# Patient Record
Sex: Female | Born: 1985 | Race: White | Hispanic: No | Marital: Single | Smoking: Never smoker
Health system: Southern US, Community
[De-identification: ages and names within clinical notes are randomized; demographics above are authoritative.]

## PROBLEM LIST (undated history)

## (undated) ENCOUNTER — Inpatient Hospital Stay (HOSPITAL_COMMUNITY): Payer: Self-pay

## (undated) DIAGNOSIS — F419 Anxiety disorder, unspecified: Secondary | ICD-10-CM

## (undated) DIAGNOSIS — M795 Residual foreign body in soft tissue: Secondary | ICD-10-CM

## (undated) DIAGNOSIS — K589 Irritable bowel syndrome without diarrhea: Secondary | ICD-10-CM

## (undated) DIAGNOSIS — R87619 Unspecified abnormal cytological findings in specimens from cervix uteri: Secondary | ICD-10-CM

## (undated) DIAGNOSIS — K56609 Unspecified intestinal obstruction, unspecified as to partial versus complete obstruction: Secondary | ICD-10-CM

## (undated) DIAGNOSIS — F4310 Post-traumatic stress disorder, unspecified: Secondary | ICD-10-CM

## (undated) DIAGNOSIS — O24419 Gestational diabetes mellitus in pregnancy, unspecified control: Secondary | ICD-10-CM

## (undated) DIAGNOSIS — K5090 Crohn's disease, unspecified, without complications: Secondary | ICD-10-CM

## (undated) HISTORY — PX: COLPOSCOPY: SHX161

## (undated) HISTORY — DX: Residual foreign body in soft tissue: M79.5

## (undated) HISTORY — DX: Unspecified abnormal cytological findings in specimens from cervix uteri: R87.619

## (undated) HISTORY — DX: Post-traumatic stress disorder, unspecified: F43.10

## (undated) HISTORY — DX: Unspecified intestinal obstruction, unspecified as to partial versus complete obstruction: K56.609

## (undated) HISTORY — DX: Anxiety disorder, unspecified: F41.9

## (undated) HISTORY — DX: Irritable bowel syndrome, unspecified: K58.9

## (undated) HISTORY — DX: Crohn's disease, unspecified, without complications: K50.90

---

## 2008-09-11 ENCOUNTER — Emergency Department (HOSPITAL_BASED_OUTPATIENT_CLINIC_OR_DEPARTMENT_OTHER): Admission: EM | Admit: 2008-09-11 | Discharge: 2008-09-12 | Payer: Self-pay | Admitting: Emergency Medicine

## 2008-09-12 ENCOUNTER — Ambulatory Visit: Payer: Self-pay | Admitting: Diagnostic Radiology

## 2010-04-07 IMAGING — CT CT ABDOMEN W/ CM
2 of 4 series · 15 of 46 positions shown, 17 images · IV contrast (APPLIED)
Comparison: None

CT ABDOMEN

CLINICAL DATA: Left upper quadrant abdominal pain.  Nausea and
vomiting.

CT ABDOMEN AND PELVIS WITH CONTRAST
TECHNIQUE: Multidetector CT imaging of the abdomen and pelvis was
performed using the standard protocol following bolus
administration of intravenous contrast.
Contrast: 100 ml [id number]

[Series 2: abd/pelvis 5.0 b31f · axial · 0.75mm/px · z∈[-720,-324]mm · 12 of 87 slices shown, 14 images]
[im 4/87  soft-tissue]
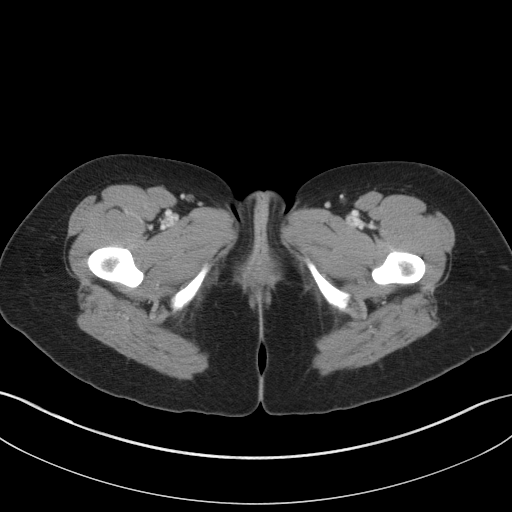
[im 4/87  bone]
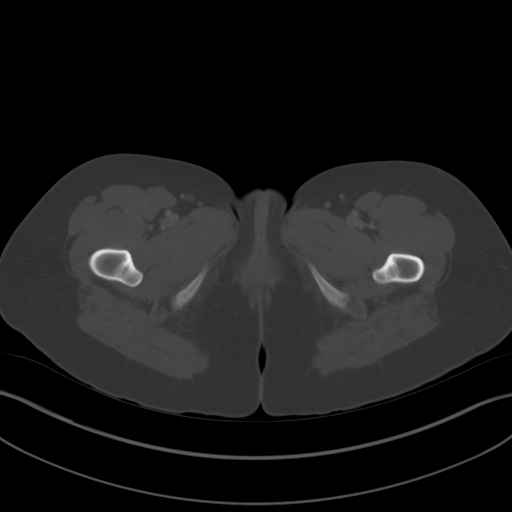
[im 12/87  soft-tissue]
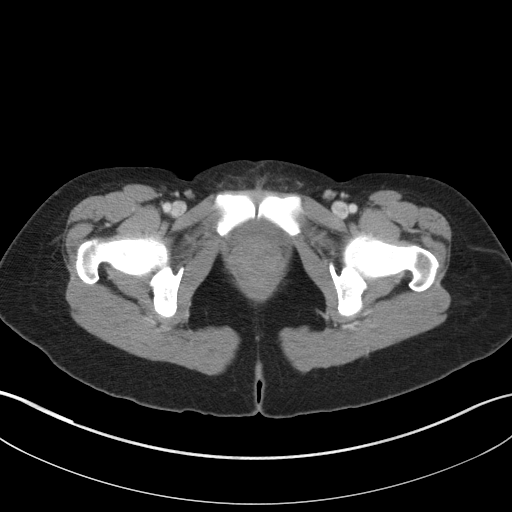
[im 19/87  soft-tissue]
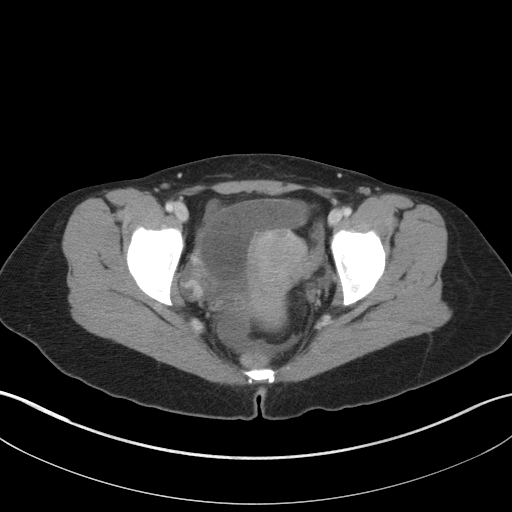
[im 27/87  soft-tissue]
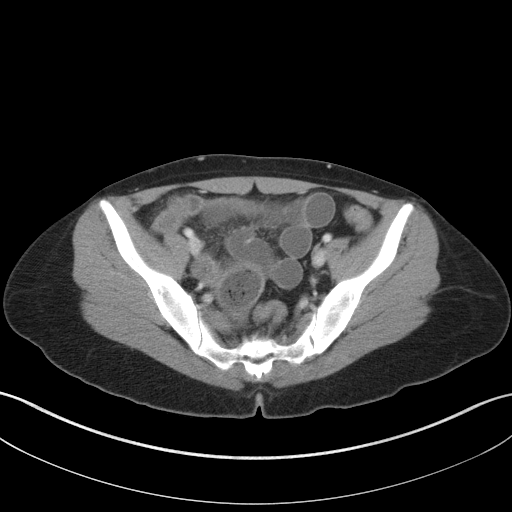
[im 34/87  soft-tissue]
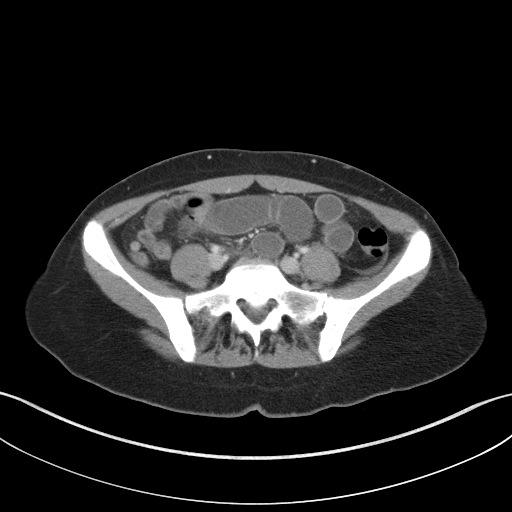
[im 42/87  soft-tissue]
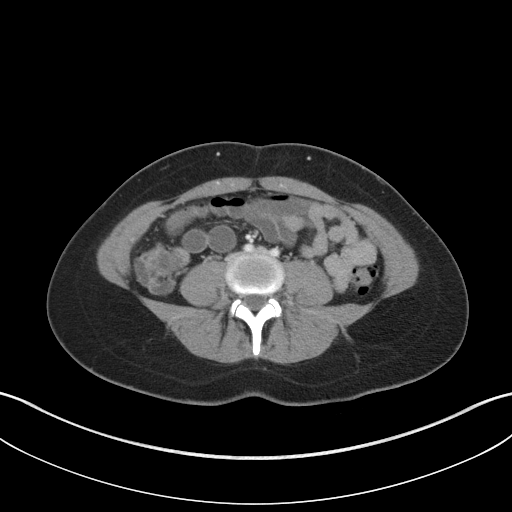
[im 45/87  soft-tissue]
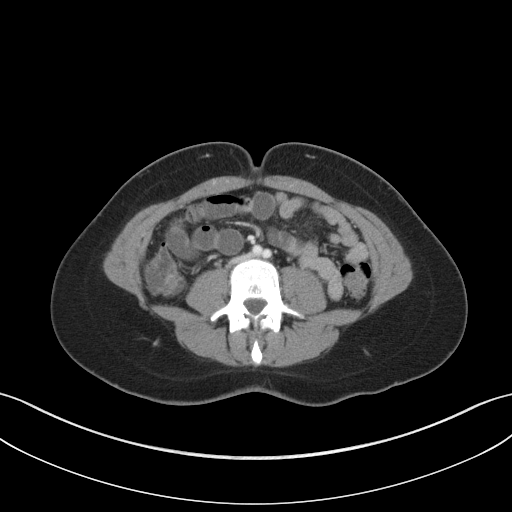
[im 53/87  soft-tissue]
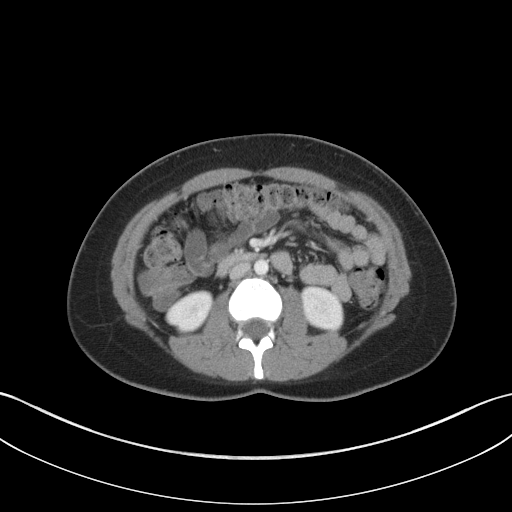
[im 60/87  soft-tissue]
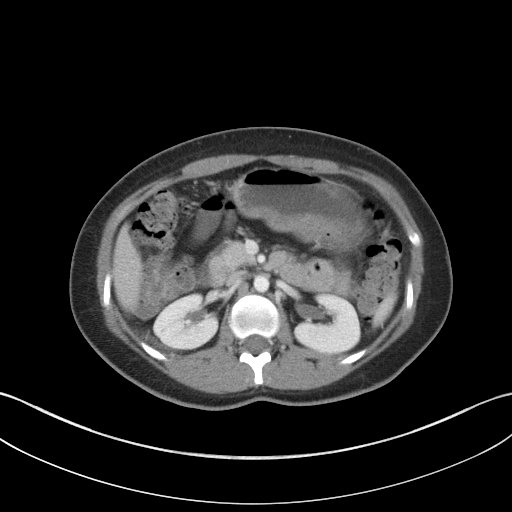
[im 60/87  bone]
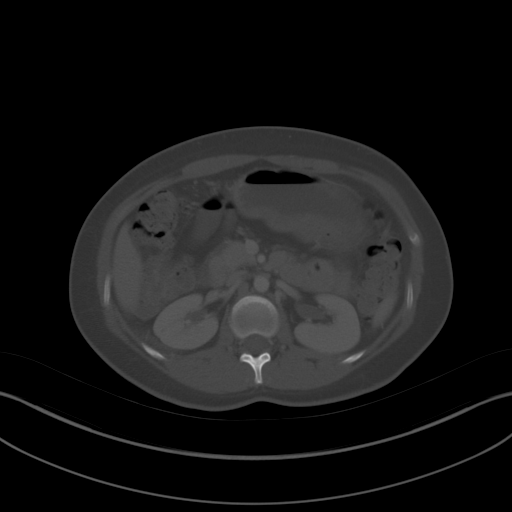
[im 68/87  soft-tissue]
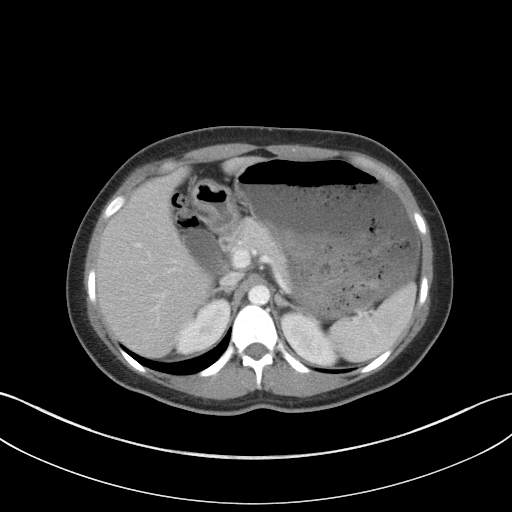
[im 75/87  soft-tissue]
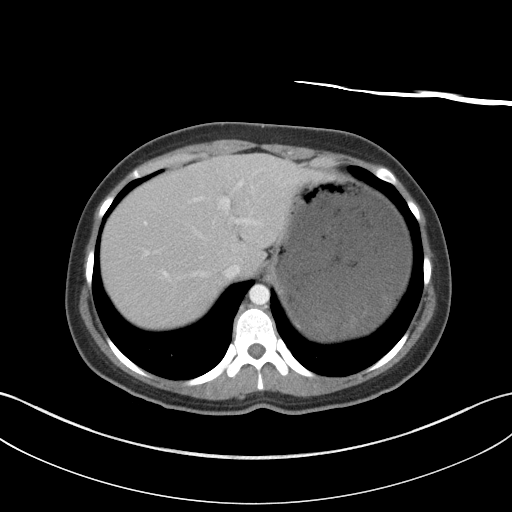
[im 83/87  soft-tissue]
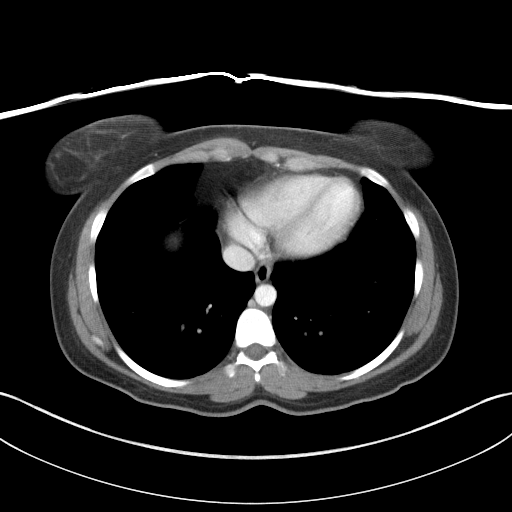

[Series 5: abd/pelvis 3.0 coronal · coronal · 0.73mm/px · 3 of 66 slices shown]
[im 22/66  soft-tissue]
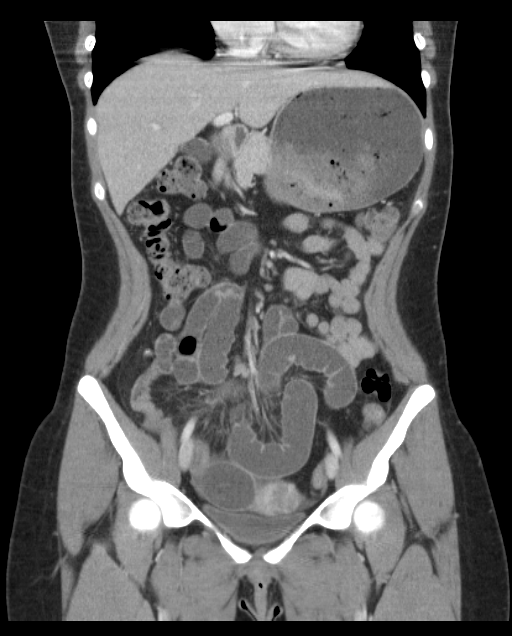
[im 29/66  soft-tissue]
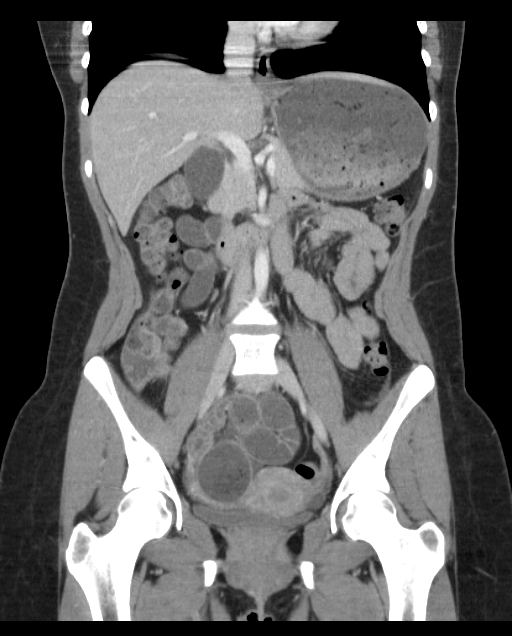
[im 37/66  soft-tissue]
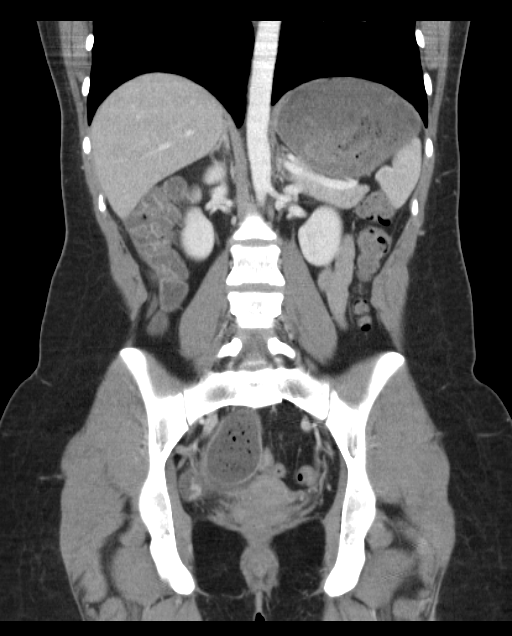

[15 of 46 positions shown; findings below may reference images not displayed]

FINDINGS: There is prominent distention of the stomach with
considerable food material in the stomach.  Although the proximal
small bowel is not dilated, there are loops of dilated small bowel
extending in the lower abdomen.  Please see CT the pelvis report
below.

The liver, spleen, pancreas, and adrenal glands appear
unremarkable.

The gallbladder and biliary system appear unremarkable.

The kidneys appear unremarkable, as do the proximal ureters.
IMPRESSION: 1.  Gastric distention, with dilated loops of mid abdominal small
bowel extending down into the pelvis.

CT PELVIS
FINDINGS: Dilated pelvic loops of small bowel are present
measuring up to 4 cm in diameter.  There is a transition point
between dilated and nondilated bowel in the mid pelvis just
eccentric to the right, as shown on image 29 of series 5.  There is
abnormal stranding in the adjacent mesentery at this transition
point, along with angulation of bowel loop.  By report the patient
has not had prior abdominal surgery.  The small bowel distal to
this transition point is small in caliber, but has mucosal
enhancement which may be an indicator of inflammation.

There is a small amount of free pelvic fluid

The left ovary appears unremarkable.  There is some faint
curvilinear enhancement within or along the right ovary, query
corpus luteum enhancement.  I would recommend a pregnancy test if
not already performed to exclude the unlikely possibility of
ectopic pregnancy on the right.

The appendix appears unremarkable.
IMPRESSION: 1.  Angulated loop of bowel is a transition point between dilated
in nondilated bowel.  There is some adjacent mesenteric edema as
well as free pelvic fluid.  The appearance is compatible with small
bowel obstruction.  There is mild mucosal enhancement of the small
bowel distal to the obstruction site, potentially representing mild
inflammation.
2.  Enhancement within the right ovary probably represents a corpus
luteum.  I do recommend a pregnancy test if not already performed
to exclude the unlikely possibility of occult ectopic pregnancy.

## 2010-08-10 LAB — DIFFERENTIAL
Eosinophils Relative: 1 % (ref 0–5)
Lymphocytes Relative: 15 % (ref 12–46)
Monocytes Absolute: 0.8 10*3/uL (ref 0.1–1.0)
Monocytes Relative: 5 % (ref 3–12)
Neutro Abs: 12.2 10*3/uL — ABNORMAL HIGH (ref 1.7–7.7)

## 2010-08-10 LAB — URINALYSIS, ROUTINE W REFLEX MICROSCOPIC
Nitrite: NEGATIVE
Protein, ur: NEGATIVE mg/dL
Specific Gravity, Urine: 1.015 (ref 1.005–1.030)
Urobilinogen, UA: 0.2 mg/dL (ref 0.0–1.0)

## 2010-08-10 LAB — COMPREHENSIVE METABOLIC PANEL
ALT: 13 U/L (ref 0–35)
AST: 22 U/L (ref 0–37)
CO2: 27 mEq/L (ref 19–32)
Calcium: 9.6 mg/dL (ref 8.4–10.5)
Creatinine, Ser: 0.8 mg/dL (ref 0.4–1.2)
GFR calc Af Amer: >60 mL/min (ref >60)
GFR calc non Af Amer: >60 mL/min (ref >60)
Sodium: 143 mEq/L (ref 135–145)
Total Protein: 7.9 g/dL (ref 6.0–8.3)

## 2010-08-10 LAB — CBC
HCT: 41 % (ref 36.0–46.0)
Hemoglobin: 14.2 g/dL (ref 12.0–15.0)
MCHC: 34.6 g/dL (ref 30.0–36.0)
RBC: 4.28 MIL/uL (ref 3.87–5.11)
RDW: 12.1 % (ref 11.5–15.5)

## 2010-08-10 LAB — PREGNANCY, URINE: Preg Test, Ur: NEGATIVE

## 2015-01-01 HISTORY — PX: BREAST REDUCTION SURGERY: SHX8

## 2016-01-31 DIAGNOSIS — M795 Residual foreign body in soft tissue: Secondary | ICD-10-CM

## 2016-01-31 HISTORY — DX: Residual foreign body in soft tissue: M79.5

## 2016-12-08 ENCOUNTER — Other Ambulatory Visit (HOSPITAL_COMMUNITY)
Admission: RE | Admit: 2016-12-08 | Discharge: 2016-12-08 | Disposition: A | Discharge status: Home / Self Care | Payer: Self-pay | Source: Ambulatory Visit | Attending: Obstetrics & Gynecology | Admitting: Obstetrics & Gynecology

## 2016-12-08 ENCOUNTER — Ambulatory Visit (INDEPENDENT_AMBULATORY_CARE_PROVIDER_SITE_OTHER): Payer: Managed Care, Other (non HMO) | Admitting: Obstetrics & Gynecology

## 2016-12-08 ENCOUNTER — Encounter: Payer: Self-pay | Admitting: Obstetrics & Gynecology

## 2016-12-08 VITALS — BP 98/62 | HR 64 | Resp 16 | Ht 62.0 in | Wt 132.0 lb

## 2016-12-08 DIAGNOSIS — Z202 Contact with and (suspected) exposure to infections with a predominantly sexual mode of transmission: Secondary | ICD-10-CM

## 2016-12-08 DIAGNOSIS — Z01419 Encounter for gynecological examination (general) (routine) without abnormal findings: Secondary | ICD-10-CM

## 2016-12-08 DIAGNOSIS — Z124 Encounter for screening for malignant neoplasm of cervix: Secondary | ICD-10-CM | POA: Diagnosis not present

## 2016-12-08 MED ORDER — NORETHIN ACE-ETH ESTRAD-FE 1-20 MG-MCG PO TABS: 1.0000 | ORAL_TABLET | Freq: Every day | ORAL | 3 refills | Status: DC

## 2016-12-08 NOTE — Addendum Note (Signed)
Addended by: Megan Salon on: 12/08/2016 05:28 PM   Modules accepted: Orders
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
## 2016-12-09 LAB — HEP, RPR, HIV PANEL
HIV SCREEN 4TH GENERATION: NONREACTIVE
Hepatitis B Surface Ag: NEGATIVE
RPR: NONREACTIVE

## 2016-12-12 LAB — CYTOLOGY - PAP
CHLAMYDIA, DNA PROBE: NEGATIVE
Diagnosis: NEGATIVE
HPV (WINDOPATH): NOT DETECTED
Neisseria Gonorrhea: NEGATIVE

## 2016-12-13 ENCOUNTER — Telehealth: Payer: Self-pay | Admitting: *Deleted

## 2016-12-13 NOTE — Telephone Encounter (Signed)
Message left to return call to N[place] Wainwright at [phone].    

## 2016-12-13 NOTE — Telephone Encounter (Signed)
-----   Message from Megan Salon, MD sent at 12/09/2016  5:52 PM EDT ----- Please notify pt that her Hep B, RPR, and HIV were negative.  GC/Chl and pap are pending.  You can hold for these results as well.

## 2016-12-13 NOTE — Telephone Encounter (Signed)
-----   Message from Megan Salon, MD sent at 12/13/2016 12:18 AM EDT ----- Please notify pt her pap is negative and HR HPV is negative as well.  02 recall.  Also, GC/Chl negative.  You can let her know about blood results as well.  Routed those results to you earlier.  Thanks.

## 2016-12-13 NOTE — Telephone Encounter (Signed)
Patient returned call. All results reviewed with patient and she verbalized understanding. Patient has aex scheduled for 02/13/18.  Patient agreeable to disposition. Will close encounter.

## 2017-01-04 ENCOUNTER — Telehealth: Payer: Self-pay | Admitting: Obstetrics & Gynecology

## 2017-01-04 NOTE — Telephone Encounter (Signed)
Returned patient's call, LMOVM to call me back.

## 2017-01-04 NOTE — Telephone Encounter (Signed)
Patient called and said she thinks she may have a urinary tract infection. She declined to schedule an appointment this week "because of work."

## 2017-01-05 NOTE — Telephone Encounter (Signed)
Called patient and LMOVM to call me back.

## 2017-01-10 NOTE — Telephone Encounter (Signed)
Dr.Miller, Marisa Sprinkles, CMA has attempted to reach this patient x 2 with no return call. Okay to close?

## 2017-01-10 NOTE — Telephone Encounter (Signed)
Ok to close encounter. 

## 2017-02-27 ENCOUNTER — Other Ambulatory Visit: Payer: Self-pay | Admitting: Obstetrics & Gynecology

## 2017-02-27 MED ORDER — NORETHIN ACE-ETH ESTRAD-FE 1-20 MG-MCG PO TABS: 1.0000 | ORAL_TABLET | Freq: Every day | ORAL | 3 refills | Status: DC

## 2017-02-27 NOTE — Telephone Encounter (Signed)
Medication refill request: OCP  Last AEX:  12-08-16  Next AEX: 02-13-18  Last MMG (if hormonal medication request): N/A Refill authorized: please advise

## 2017-02-27 NOTE — Telephone Encounter (Signed)
Can you please all pt and get an update on her bleeding?

## 2017-02-27 NOTE — Telephone Encounter (Signed)
Spoke with patient and she bled for the first two weeks after starting the OCP but has not bled since. She has been taking the OCP for about 3 months. She is wanting to get a birth control that does not have the last week of pills in it. She states that she "feels weird down there" when she takes the last week of pills  Please advise

## 2017-02-27 NOTE — Telephone Encounter (Signed)
RF done for Junel without the FE.  She will not have placebo pills and can run the packs together or just skip that week--whatever is best for her

## 2017-02-28 NOTE — Telephone Encounter (Signed)
Spoke with patient and gave recommendations per Dr. Sabra Heck. Patient voiced understanding -eh

## 2017-05-01 ENCOUNTER — Encounter: Payer: Self-pay | Admitting: Certified Nurse Midwife

## 2017-05-01 ENCOUNTER — Other Ambulatory Visit: Payer: Self-pay

## 2017-05-01 ENCOUNTER — Ambulatory Visit (INDEPENDENT_AMBULATORY_CARE_PROVIDER_SITE_OTHER): Payer: Managed Care, Other (non HMO) | Admitting: Certified Nurse Midwife

## 2017-05-01 VITALS — BP 98/60 | HR 88 | Resp 14 | Wt 141.5 lb

## 2017-05-01 DIAGNOSIS — Z8659 Personal history of other mental and behavioral disorders: Secondary | ICD-10-CM | POA: Diagnosis not present

## 2017-05-01 DIAGNOSIS — N912 Amenorrhea, unspecified: Secondary | ICD-10-CM | POA: Diagnosis not present

## 2017-05-01 DIAGNOSIS — Z3201 Encounter for pregnancy test, result positive: Secondary | ICD-10-CM

## 2017-05-01 LAB — POCT URINE PREGNANCY: Preg Test, Ur: POSITIVE — AB

## 2017-05-01 NOTE — Progress Notes (Signed)
31 y.o.marital status race female g0p0 presents with amenorrhea with + UPT on 04/27/17.Marland Kitchen LMP 03/10/17. Unplanned  pregnancy. Complaining of slight breast tenderness, fatigue, some nausea. Denies vomiting only x 1. Denies spotting, bleeding or change in discharge. Occasional cramping" like period would start", only..  Medications she is taking are: Prenatal vitamins. Was previously on Wellbutrin and Zoloft for anxiety. She doesn'tt want to restart.  Contraception Junel Fe, but had been missing pills or forgetting to take them. Partner supportive and with patient today. Patient requests his presence.   ROS Pertinent to above  O: HPI pertinent to above. Healthy WDWN female Affect: normal, orientation x 3  Last Aex:12/08/16 Pap smear:  negative           Rubella screen: not done  A: Amenorrhea with positive UPT  7 wk 2 days per LNMP with EDC 12/14/17 Unplanned pregnancy, but happy Previous use of Wellbutrin/Zoloft for depression/anxiety, has stopped. Sees Psychiatrist for counseling.   P: Reviewed with patient importance of prenatal care during pregnancy. Given OB provider list. Reviewed nutrition importance of pregnancy and selecting from all food groups and making sure to have adequate protein intake daily. Discussed avoiding raw or exotic fish, soft cheeses due to risk of bacteria . Discussed concerns with FAS with alcohol use in pregnancy. Discussed increase of IUGR and SIDS with smoking use or second smoke. Reviewed warning signs of early pregnancy and need to advise if occurs. Discussed comfort measures for early pregnancy changes and nausea. Warning signs of nausea and vomiting given and needed to advise. Offered viability PUS here prior to initiating prenatal care. Patient would like to have PUS. She is aware she will be called with insurance information and scheduled. Questions addressed at length.   Rv prn and above  32 minutes in time spent with patient in face to face counseling regarding  pregnancy and prenatal care

## 2017-05-01 NOTE — Patient Instructions (Signed)

## 2017-05-02 NOTE — L&D Delivery Note (Signed)
Patient is a 33 y.o. now G1P1 s/p NSVD at [redacted]w[redacted]d who was admitted for IOL for A1GDM and Polyhydramnios.  She progressed with augmentation (Cytotec and Pitocin) to complete and pushed 1 hour and 35 minutes to deliver. Delivery call made to have NICU at bedside due to variable, late decelerations and chorio during labor. Baby delivered with spontaneous cry. NICU okay with delayed cord clamping. Cord clamping delayed by several minutes then clamped by CNM and cut by FOB.  Placenta intact and spontaneous, bleeding minimal.  2nd degree and left labial laceration repaired without difficulty.  Mom and baby stable prior to transfer to postpartum. She plans on formula feeding. She requests Nexplanon for birth control.  Delivery Note At 5:03 PM a viable and healthy female was delivered via Vaginal, Spontaneous (Presentation:ROA ).  APGAR: 8, 8; weight 7 lb 5.6 oz (3334 g).    Placenta intact and spontaneous, bleeding minimal- sent to pathology for chorio, maternal fever and baby tachycardia. 3V Cord  Anesthesia: Epidural Episiotomy: None Lacerations: 2nd degree;Labial Suture Repair: 2.0 3.0 vicryl Est. Blood Loss (mL): 100  Mom to postpartum.  Baby to Couplet care / Skin to Skin.  VLajean ManesCNM 12/20/2017, 6:37 PM      VLajean Manes CNM 12/20/17, 6:37 PM

## 2017-05-04 ENCOUNTER — Telehealth: Payer: Self-pay | Admitting: Certified Nurse Midwife

## 2017-05-04 NOTE — Telephone Encounter (Signed)
Call placed to patient to review benefits for ultrasound. Left voicemail message requesting a return call for scheduling

## 2017-05-05 ENCOUNTER — Other Ambulatory Visit: Payer: Self-pay | Admitting: *Deleted

## 2017-05-05 DIAGNOSIS — Z3201 Encounter for pregnancy test, result positive: Secondary | ICD-10-CM

## 2017-05-05 DIAGNOSIS — N912 Amenorrhea, unspecified: Secondary | ICD-10-CM

## 2017-05-09 NOTE — Telephone Encounter (Signed)
Patient called and cancelled her ultrasound for 05/11/17. She'd like to reschedule to another day when her spouse can come too.

## 2017-05-11 ENCOUNTER — Other Ambulatory Visit: Payer: Managed Care, Other (non HMO) | Admitting: Obstetrics & Gynecology

## 2017-05-11 ENCOUNTER — Other Ambulatory Visit: Payer: Managed Care, Other (non HMO)

## 2017-05-16 NOTE — Telephone Encounter (Signed)
Spoke with patient to re-scheduled previously cancelled ultrasound. Patient is re-scheduled 05/18/17 with Dr Sabra Heck. Patient aware of appointment date, arrival time and cancellation policy. No further questions. Ok to close   Routing to Dr Sabra Heck for review  cc: Melvia Heaps, CNM

## 2017-05-18 ENCOUNTER — Ambulatory Visit (INDEPENDENT_AMBULATORY_CARE_PROVIDER_SITE_OTHER): Payer: Managed Care, Other (non HMO) | Admitting: Obstetrics & Gynecology

## 2017-05-18 ENCOUNTER — Ambulatory Visit (INDEPENDENT_AMBULATORY_CARE_PROVIDER_SITE_OTHER): Payer: Managed Care, Other (non HMO)

## 2017-05-18 VITALS — BP 100/70 | HR 68 | Resp 16 | Ht 62.0 in | Wt 142.0 lb

## 2017-05-18 DIAGNOSIS — Z3201 Encounter for pregnancy test, result positive: Secondary | ICD-10-CM | POA: Diagnosis not present

## 2017-05-18 DIAGNOSIS — N912 Amenorrhea, unspecified: Secondary | ICD-10-CM | POA: Diagnosis not present

## 2017-05-18 NOTE — Progress Notes (Signed)
32 y.o. G23P0000 Single Caucasian female here for pelvic ultrasound for viability.  LMP 03/10/17 but pt missed several pills in pack that she took the prior month.  Is having breast tenderness and nausea.  Denies vaginal bleeding or pelvic pain.  Wasn't planning this but is excited.  Patient's last menstrual period was 03/10/2017.  Findings:  UTERUS: Singleton IUP noted with normal gestational sac, yolk sac and fetal pole with +FCA at 170 bpm.  8 2/7 weeks by this ultrasound which is BOE. ADNEXA: Left ovary: 2.9cm       Right ovary: 3.7cm with small corpus luteal cyst noted CUL DE SAC: no free fluid  Discussion:  Findigns reviewed.  Dating disucssed.  Transfer of care appropriate at this time.  Options reviewed.  Not sure yet where she will go for ob care.  Reviewed no changing cat litter.  They have no cats in the home.   Tdap unsure.  Will be updated in pregnancy.  Had chicken pox.  Aware flu vaccine safe and recommended.  Has not gotten one.  Encouraged to proceed.  Fish/shellfish/mercury discuss.  Patient does not eat much fish.  Unpasteurized cheese/juices discussed.  Nitrites in foods disucssed.  Exercise and intercourse discussed.  Fetal DNA particle testing discussed.  First trimester down's testing discussed.  Cystic fibrosis discussed.  They will consider options and check cost with insurance company.  Assessment:  Tammy Rivas IUD at 8 2/7 weeks  Plan:  Pt will notify office of where to send records.  Wished well in pregnancy.  ~20 minutes spent with patient >50% of time was in face to face discussion of above.

## 2017-05-21 ENCOUNTER — Encounter: Payer: Self-pay | Admitting: Obstetrics & Gynecology

## 2017-06-09 ENCOUNTER — Ambulatory Visit (INDEPENDENT_AMBULATORY_CARE_PROVIDER_SITE_OTHER): Payer: Managed Care, Other (non HMO) | Admitting: Certified Nurse Midwife

## 2017-06-09 ENCOUNTER — Encounter: Payer: Self-pay | Admitting: Certified Nurse Midwife

## 2017-06-09 VITALS — BP 100/61 | HR 75 | Wt 141.0 lb

## 2017-06-09 DIAGNOSIS — Z3400 Encounter for supervision of normal first pregnancy, unspecified trimester: Secondary | ICD-10-CM

## 2017-06-09 DIAGNOSIS — Z113 Encounter for screening for infections with a predominantly sexual mode of transmission: Secondary | ICD-10-CM | POA: Diagnosis not present

## 2017-06-09 DIAGNOSIS — O0993 Supervision of high risk pregnancy, unspecified, third trimester: Secondary | ICD-10-CM | POA: Insufficient documentation

## 2017-06-09 DIAGNOSIS — Z3480 Encounter for supervision of other normal pregnancy, unspecified trimester: Secondary | ICD-10-CM

## 2017-06-09 DIAGNOSIS — Z8659 Personal history of other mental and behavioral disorders: Secondary | ICD-10-CM

## 2017-06-09 DIAGNOSIS — Z3481 Encounter for supervision of other normal pregnancy, first trimester: Secondary | ICD-10-CM
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
## 2017-06-09 NOTE — Patient Instructions (Signed)
First Trimester of Pregnancy The first trimester of pregnancy is from week 1 until the end of week 13 (months 1 through 3). During this time, your baby will begin to develop inside you. At 6-8 weeks, the eyes and face are formed, and the heartbeat can be seen on ultrasound. At the end of 12 weeks, all the baby's organs are formed. Prenatal care is all the medical care you receive before the birth of your baby. Make sure you get good prenatal care and follow all of your doctor's instructions. Follow these instructions at home: Medicines  Take over-the-counter and prescription medicines only as told by your doctor. Some medicines are safe and some medicines are not safe during pregnancy.  Take a prenatal vitamin that contains at least 600 micrograms (mcg) of folic acid.  If you have trouble pooping (constipation), take medicine that will make your stool soft (stool softener) if your doctor approves. Eating and drinking  Eat regular, healthy meals.  Your doctor will tell you the amount of weight gain that is right for you.  Avoid raw meat and uncooked cheese.  If you feel sick to your stomach (nauseous) or throw up (vomit): ? Eat 4 or 5 small meals a day instead of 3 large meals. ? Try eating a few soda crackers. ? Drink liquids between meals instead of during meals.  To prevent constipation: ? Eat foods that are high in fiber, like fresh fruits and vegetables, whole grains, and beans. ? Drink enough fluids to keep your pee (urine) clear or pale yellow. Activity  Exercise only as told by your doctor. Stop exercising if you have cramps or pain in your lower belly (abdomen) or low back.  Do not exercise if it is too hot, too humid, or if you are in a place of great height (high altitude).  Try to avoid standing for long periods of time. Move your legs often if you must stand in one place for a long time.  Avoid heavy lifting.  Wear low-heeled shoes. Sit and stand up straight.  You  can have sex unless your doctor tells you not to. Relieving pain and discomfort  Wear a good support bra if your breasts are sore.  Take warm water baths (sitz baths) to soothe pain or discomfort caused by hemorrhoids. Use hemorrhoid cream if your doctor says it is okay.  Rest with your legs raised if you have leg cramps or low back pain.  If you have puffy, bulging veins (varicose veins) in your legs: ? Wear support hose or compression stockings as told by your doctor. ? Raise (elevate) your feet for 15 minutes, 3-4 times a day. ? Limit salt in your food. Prenatal care  Schedule your prenatal visits by the twelfth week of pregnancy.  Write down your questions. Take them to your prenatal visits.  Keep all your prenatal visits as told by your doctor. This is important. Safety  Wear your seat belt at all times when driving.  Make a list of emergency phone numbers. The list should include numbers for family, friends, the hospital, and police and fire departments. General instructions  Ask your doctor for a referral to a local prenatal class. Begin classes no later than at the start of month 6 of your pregnancy.  Ask for help if you need counseling or if you need help with nutrition. Your doctor can give you advice or tell you where to go for help.  Do not use hot tubs, steam rooms, or  saunas.  Do not douche or use tampons or scented sanitary pads.  Do not cross your legs for long periods of time.  Avoid all herbs and alcohol. Avoid drugs that are not approved by your doctor.  Do not use any tobacco products, including cigarettes, chewing tobacco, and electronic cigarettes. If you need help quitting, ask your doctor. You may get counseling or other support to help you quit.  Avoid cat litter boxes and soil used by cats. These carry germs that can cause birth defects in the baby and can cause a loss of your baby (miscarriage) or stillbirth.  Visit your dentist. At home, brush  your teeth with a soft toothbrush. Be gentle when you floss. Contact a doctor if:  You are dizzy.  You have mild cramps or pressure in your lower belly.  You have a nagging pain in your belly area.  You continue to feel sick to your stomach, you throw up, or you have watery poop (diarrhea).  You have a bad smelling fluid coming from your vagina.  You have pain when you pee (urinate).  You have increased puffiness (swelling) in your face, hands, legs, or ankles. Get help right away if:  You have a fever.  You are leaking fluid from your vagina.  You have spotting or bleeding from your vagina.  You have very bad belly cramping or pain.  You gain or lose weight rapidly.  You throw up blood. It may look like coffee grounds.  You are around people who have [place] measles, fifth disease, or chickenpox.  You have a very bad headache.  You have shortness of breath.  You have any kind of trauma, such as from a fall or a car accident. Summary  The first trimester of pregnancy is from week 1 until the end of week 13 (months 1 through 3).  To take care of yourself and your unborn baby, you will need to eat healthy meals, take medicines only if your doctor tells you to do so, and do activities that are safe for you and your baby.  Keep all follow-up visits as told by your doctor. This is important as your doctor will have to ensure that your baby is healthy and growing well. This information is not intended to replace advice given to you by your health care provider. Make sure you discuss any questions you have with your health care provider. Document Released: 10/05/2007 Document Revised: 04/26/2016 Document Reviewed: 04/26/2016 Elsevier Interactive Patient Education  2017 [employer].

## 2017-06-11 LAB — CULTURE, OB URINE

## 2017-06-11 LAB — URINE CULTURE, OB REFLEX

## 2017-06-13 LAB — GC/CHLAMYDIA PROBE AMP (~~LOC~~) NOT AT ARMC
Chlamydia: NEGATIVE
Neisseria Gonorrhea: NEGATIVE

## 2017-06-16 LAB — OBSTETRIC PANEL
Antibody Screen: NOT DETECTED
Basophils Absolute: 23 cells/uL (ref 0–200)
Basophils Relative: 0.3 %
Eosinophils Absolute: 78 cells/uL (ref 15–500)
Eosinophils Relative: 1 %
HCT: 33.7 % — ABNORMAL LOW (ref 35.0–45.0)
Hemoglobin: 11.8 g/dL (ref 11.7–15.5)
Hepatitis B Surface Ag: NONREACTIVE
Lymphs Abs: 2223 cells/uL (ref 850–3900)
MCH: 32.3 pg (ref 27.0–33.0)
MCHC: 35 g/dL (ref 32.0–36.0)
MCV: 92.3 fL (ref 80.0–100.0)
MPV: 10.1 fL (ref 7.5–12.5)
Monocytes Relative: 7.5 %
Neutro Abs: 4891 cells/uL (ref 1500–7800)
Neutrophils Relative %: 62.7 %
Platelets: 341 10*3/uL (ref 140–400)
RBC: 3.65 10*6/uL — ABNORMAL LOW (ref 3.80–5.10)
RDW: 11.9 % (ref 11.0–15.0)
RPR Ser Ql: NONREACTIVE
Rubella: 2.13 index
Total Lymphocyte: 28.5 %
WBC mixed population: 585 cells/uL (ref 200–950)
WBC: 7.8 10*3/uL (ref 3.8–10.8)

## 2017-06-16 LAB — CYSTIC FIBROSIS DIAGNOSTIC STUDY

## 2017-06-16 LAB — HIV ANTIBODY (ROUTINE TESTING W REFLEX): HIV 1&2 Ab, 4th Generation: NONREACTIVE

## 2017-06-27 ENCOUNTER — Encounter: Payer: Self-pay | Admitting: *Deleted

## 2017-06-27 DIAGNOSIS — Z3400 Encounter for supervision of normal first pregnancy, unspecified trimester: Secondary | ICD-10-CM

## 2017-06-28 ENCOUNTER — Other Ambulatory Visit: Payer: Self-pay | Admitting: Certified Nurse Midwife

## 2017-06-28 DIAGNOSIS — Z3400 Encounter for supervision of normal first pregnancy, unspecified trimester: Secondary | ICD-10-CM

## 2017-07-27 ENCOUNTER — Ambulatory Visit (HOSPITAL_COMMUNITY): Payer: Managed Care, Other (non HMO)

## 2017-07-27 ENCOUNTER — Ambulatory Visit (HOSPITAL_COMMUNITY)
Admission: RE | Admit: 2017-07-27 | Discharge: 2017-07-27 | Disposition: A | Discharge status: Home / Self Care | Payer: Managed Care, Other (non HMO) | Source: Ambulatory Visit | Attending: Certified Nurse Midwife | Admitting: Certified Nurse Midwife

## 2017-07-27 DIAGNOSIS — Z3482 Encounter for supervision of other normal pregnancy, second trimester: Secondary | ICD-10-CM | POA: Insufficient documentation

## 2017-07-27 DIAGNOSIS — Z3400 Encounter for supervision of normal first pregnancy, unspecified trimester: Secondary | ICD-10-CM

## 2017-07-27 DIAGNOSIS — Z3A18 18 weeks gestation of pregnancy: Secondary | ICD-10-CM | POA: Diagnosis not present

## 2017-07-27 DIAGNOSIS — Z3689 Encounter for other specified antenatal screening: Secondary | ICD-10-CM | POA: Diagnosis present

## 2017-07-28 ENCOUNTER — Encounter: Payer: Managed Care, Other (non HMO) | Admitting: Certified Nurse Midwife

## 2017-07-31 ENCOUNTER — Ambulatory Visit (INDEPENDENT_AMBULATORY_CARE_PROVIDER_SITE_OTHER): Payer: Managed Care, Other (non HMO) | Admitting: Advanced Practice Midwife

## 2017-07-31 ENCOUNTER — Encounter: Payer: Self-pay | Admitting: Advanced Practice Midwife

## 2017-07-31 VITALS — BP 94/60 | HR 98 | Wt 148.0 lb

## 2017-07-31 DIAGNOSIS — O26899 Other specified pregnancy related conditions, unspecified trimester: Principal | ICD-10-CM

## 2017-07-31 DIAGNOSIS — N898 Other specified noninflammatory disorders of vagina: Secondary | ICD-10-CM

## 2017-07-31 DIAGNOSIS — R21 Rash and other nonspecific skin eruption: Secondary | ICD-10-CM

## 2017-07-31 DIAGNOSIS — Z3400 Encounter for supervision of normal first pregnancy, unspecified trimester: Secondary | ICD-10-CM

## 2017-07-31 DIAGNOSIS — Z3402 Encounter for supervision of normal first pregnancy, second trimester: Secondary | ICD-10-CM

## 2017-07-31 DIAGNOSIS — O26892 Other specified pregnancy related conditions, second trimester: Secondary | ICD-10-CM

## 2017-07-31 DIAGNOSIS — M25552 Pain in left hip: Secondary | ICD-10-CM

## 2017-07-31 MED ORDER — TERCONAZOLE 0.4 % VA CREA: 1.0000 | TOPICAL_CREAM | Freq: Every day | VAGINAL | 0 refills | Status: DC

## 2017-07-31 NOTE — Progress Notes (Signed)
   PRENATAL VISIT NOTE  Subjective:  Tammy Rivas is a 32 y.o. G1P0000 at 35w3dbeing seen today for ongoing prenatal care.  She is currently monitored for the following issues for this low-risk pregnancy and has Supervision of normal first pregnancy, antepartum; History of anxiety; Rash; Vagina itching; and Left hip pain on their problem list.  Patient reports vaginal itching, rash on chest, left hip pain.  Contractions: Not present. Vag. Bleeding: None.  Movement: Present. Denies leaking of fluid.   The following portions of the patient's history were reviewed and updated as appropriate: allergies, current medications, past family history, past medical history, past social history, past surgical history and problem list. Problem list updated.  Objective:   Vitals:   07/31/17 0844  BP: 94/60  Pulse: 98  Weight: 148 lb (67.1 kg)    Fetal Status: Fetal Heart Rate (bpm): 143   Movement: Present     General:  Alert, oriented and cooperative. Patient is in no acute distress.  Skin: Skin is warm and dry. No rash noted.   Cardiovascular: Normal heart rate noted  Respiratory: Normal respiratory effort, no problems with respiration noted  Abdomen: Soft, gravid, appropriate for gestational age.  Pain/Pressure: Absent     Pelvic: Cervical exam deferred        External exam, mild erethema, wet prep obtained  Extremities: Normal range of motion.  Edema: None, normal gait, has intermittent pain in left hip  Mental Status: Normal mood and affect. Normal behavior. Normal judgment and thought content.  SKIN                                 Diffuse petechial-like lesions on chest, 1-2 on face, 1-2 on upper arm                                          Non-pruritic  Assessment and Plan:  Pregnancy: G1P0000 at 176w3d1. Vaginal discharge during pregnancy, antepartum      Rx Terazol 7 presumptively - WET PREP FOR TRICH, YEAST, CLUE  2. Pain of left hip joint     Probably from softening, refer to  PT if worsens or persists  3. Rash      Appear to be petechiae, but only on chest with single lesion on face/arm.  Referred to Derm - Ambulatory referral to Dermatology  4. Supervision of normal first pregnancy, antepartum     Reviewed [place] were normal, female     Box updated  5. Vagina itching     Wet prep  6. Left hip pain      See above  Preterm labor symptoms and general obstetric precautions including but not limited to vaginal bleeding, contractions, leaking of fluid and fetal movement were reviewed in detail with the patient. Please refer to After Visit Summary for other counseling recommendations.  Return in about 8 weeks (around 09/25/2017).  Future Appointments  Date Time Provider [place]5/30/2019  2:00 PM DoEmily FilbertMD [facility]  MaHansel FeinsteinCNM

## 2017-07-31 NOTE — Patient Instructions (Signed)
Second Trimester of Pregnancy The second trimester is from week 13 through week 28, month 4 through 6. This is often the time in pregnancy that you feel your best. Often times, morning sickness has lessened or quit. You may have more energy, and you may get hungry more often. Your unborn baby (fetus) is growing rapidly. At the end of the sixth month, he or she is about 9 inches long and weighs about 1 pounds. You will likely feel the baby move (quickening) between 18 and 20 weeks of pregnancy. Follow these instructions at home:  Avoid all smoking, herbs, and alcohol. Avoid drugs not approved by your doctor.  Do not use any tobacco products, including cigarettes, chewing tobacco, and electronic cigarettes. If you need help quitting, ask your doctor. You may get counseling or other support to help you quit.  Only take medicine as told by your doctor. Some medicines are safe and some are not during pregnancy.  Exercise only as told by your doctor. Stop exercising if you start having cramps.  Eat regular, healthy meals.  Wear a good support bra if your breasts are tender.  Do not use hot tubs, steam rooms, or saunas.  Wear your seat belt when driving.  Avoid raw meat, uncooked cheese, and liter boxes and soil used by cats.  Take your prenatal vitamins.  Take 1500-2000 milligrams of calcium daily starting at the 20th week of pregnancy until you deliver your baby.  Try taking medicine that helps you poop (stool softener) as needed, and if your doctor approves. Eat more fiber by eating fresh fruit, vegetables, and whole grains. Drink enough fluids to keep your pee (urine) clear or pale yellow.  Take warm water baths (sitz baths) to soothe pain or discomfort caused by hemorrhoids. Use hemorrhoid cream if your doctor approves.  If you have puffy, bulging veins (varicose veins), wear support hose. Raise (elevate) your feet for 15 minutes, 3-4 times a day. Limit salt in your diet.  Avoid heavy  lifting, wear low heals, and sit up straight.  Rest with your legs raised if you have leg cramps or low back pain.  Visit your dentist if you have not gone during your pregnancy. Use a soft toothbrush to brush your teeth. Be gentle when you floss.  You can have sex (intercourse) unless your doctor tells you not to.  Go to your doctor visits. Get help if:  You feel dizzy.  You have mild cramps or pressure in your lower belly (abdomen).  You have a nagging pain in your belly area.  You continue to feel sick to your stomach (nauseous), throw up (vomit), or have watery poop (diarrhea).  You have bad smelling fluid coming from your vagina.  You have pain with peeing (urination). Get help right away if:  You have a fever.  You are leaking fluid from your vagina.  You have spotting or bleeding from your vagina.  You have severe belly cramping or pain.  You lose or gain weight rapidly.  You have trouble catching your breath and have chest pain.  You notice sudden or extreme puffiness (swelling) of your face, hands, ankles, feet, or legs.  You have not felt the baby move in over an hour.  You have severe headaches that do not go away with medicine.  You have vision changes. This information is not intended to replace advice given to you by your health care provider. Make sure you discuss any questions you have with your health care  provider. Document Released: 07/13/2009 Document Revised: 09/24/2015 Document Reviewed: 06/19/2012 Elsevier Interactive Patient Education  2017 Elsevier Inc[employer] Hip Pain The hip is the joint between the upper legs and the lower pelvis. The bones, cartilage, tendons, and muscles of your hip joint support your body and allow you to move around. Hip pain can range from a minor ache to severe pain in one or both of your hips. The pain may be felt on the inside of the hip joint near the groin, or the outside near the buttocks and upper thigh. You may also  have swelling or stiffness. Follow these instructions at home: Managing pain, stiffness, and swelling  If directed, apply ice to the injured area. ? Put ice in a plastic bag. ? Place a towel between your skin and the bag. ? Leave the ice on for 20 minutes, 2-3 times a day  Sleep with a pillow between your legs on your most comfortable side.  Avoid any activities that cause pain. General instructions  Take over-the-counter and prescription medicines only as told by your health care provider.  Do any exercises as told by your health care provider.  Record the following: ? How often you have hip pain. ? The location of your pain. ? What the pain feels like. ? What makes the pain worse.  Keep all follow-up visits as told by your health care provider. This is important. Contact a health care provider if:  You cannot put weight on your leg.  Your pain or swelling continues or gets worse after one week.  It gets harder to walk.  You have a fever. Get help right away if:  You fall.  You have a sudden increase in pain and swelling in your hip.  Your hip is red or swollen or very tender to touch. Summary  Hip pain can range from a minor ache to severe pain in one or both of your hips.  The pain may be felt on the inside of the hip joint near the groin, or the outside near the buttocks and upper thigh.  Avoid any activities that cause pain.  Record how often you have hip pain, the location of the pain, what makes it worse and what it feels like. This information is not intended to replace advice given to you by your health care provider. Make sure you discuss any questions you have with your health care provider. Document Released: 10/06/2009 Document Revised: 03/21/2016 Document Reviewed: 03/21/2016 Elsevier Interactive Patient Education  [employer].

## 2017-07-31 NOTE — Progress Notes (Signed)
Pt c/o left hip pain that radiates down her leg. Pt c/o skin break outs. PT c/o vaginal discharge with some itching.

## 2017-08-01 LAB — WET PREP FOR TRICH, YEAST, CLUE
MICRO NUMBER:: 90401008
SPECIMEN QUALITY 3963: ADEQUATE

## 2017-08-28 ENCOUNTER — Telehealth: Payer: Self-pay | Admitting: Obstetrics & Gynecology

## 2017-08-28 NOTE — Telephone Encounter (Signed)
Received letter from [facility] [place] [phone]. [facility] [place] [facility] appt.

## 2017-08-31 ENCOUNTER — Encounter: Payer: Self-pay | Admitting: Obstetrics & Gynecology

## 2017-09-14 ENCOUNTER — Ambulatory Visit: Payer: Managed Care, Other (non HMO) | Admitting: Obstetrics & Gynecology

## 2017-09-14 VITALS — BP 102/67 | HR 72 | Wt 155.0 lb

## 2017-09-14 DIAGNOSIS — Z3400 Encounter for supervision of normal first pregnancy, unspecified trimester: Secondary | ICD-10-CM

## 2017-09-28 ENCOUNTER — Encounter: Payer: Managed Care, Other (non HMO) | Admitting: Obstetrics & Gynecology

## 2017-09-28 ENCOUNTER — Other Ambulatory Visit: Payer: Managed Care, Other (non HMO)

## 2017-09-28 DIAGNOSIS — Z3400 Encounter for supervision of normal first pregnancy, unspecified trimester: Secondary | ICD-10-CM

## 2017-09-29 ENCOUNTER — Other Ambulatory Visit: Payer: Self-pay | Admitting: *Deleted

## 2017-09-29 ENCOUNTER — Encounter: Payer: Self-pay | Admitting: Obstetrics & Gynecology

## 2017-09-29 ENCOUNTER — Telehealth: Payer: Self-pay

## 2017-09-29 DIAGNOSIS — O24410 Gestational diabetes mellitus in pregnancy, diet controlled: Secondary | ICD-10-CM | POA: Insufficient documentation

## 2017-09-29 DIAGNOSIS — O24419 Gestational diabetes mellitus in pregnancy, unspecified control: Secondary | ICD-10-CM

## 2017-09-29 LAB — CBC
HCT: 32.6 % — ABNORMAL LOW (ref 35.0–45.0)
Hemoglobin: 11.1 g/dL — ABNORMAL LOW (ref 11.7–15.5)
MCH: 31.7 pg (ref 27.0–33.0)
MCHC: 34 g/dL (ref 32.0–36.0)
MCV: 93.1 fL (ref 80.0–100.0)
MPV: 10.5 fL (ref 7.5–12.5)
PLATELETS: 302 10*3/uL (ref 140–400)
RBC: 3.5 10*6/uL — ABNORMAL LOW (ref 3.80–5.10)
RDW: 12 % (ref 11.0–15.0)
WBC: 10.8 10*3/uL (ref 3.8–10.8)

## 2017-09-29 LAB — RPR: RPR: NONREACTIVE

## 2017-09-29 LAB — 2HR GTT W 1 HR, CARPENTER, 75 G
Glucose, 1 Hr, Gest: 171 mg/dL (ref 65–179)
Glucose, 2 Hr, Gest: 157 mg/dL — ABNORMAL HIGH (ref 65–152)
Glucose, Fasting, Gest: 81 mg/dL (ref 65–91)

## 2017-09-29 LAB — HIV ANTIBODY (ROUTINE TESTING W REFLEX): HIV: NONREACTIVE

## 2017-09-29 MED ORDER — ACCU-CHEK FASTCLIX LANCETS MISC: 1.0000 | Freq: Four times a day (QID) | 12 refills | Status: DC

## 2017-09-29 MED ORDER — ACCU-CHEK AVIVA DEVI: 0 refills | Status: AC

## 2017-09-29 MED ORDER — GLUCOSE BLOOD VI STRP: ORAL_STRIP | 12 refills | Status: DC

## 2017-09-29 NOTE — Telephone Encounter (Addendum)
Left message on pt's voicemail with this information per Dr. Hulan Fray. Supplies and referral sent.   ----- Message from Emily Filbert, MD sent at 09/29/2017  8:08 AM EDT ----- Her 2 hour GTT was abnormal. She has GDM and will need to go to the dietician. She should start testing her sugars- fasting, 2 hour PCs and at bedtime.

## 2017-10-12 ENCOUNTER — Ambulatory Visit (INDEPENDENT_AMBULATORY_CARE_PROVIDER_SITE_OTHER): Payer: Managed Care, Other (non HMO) | Admitting: Obstetrics & Gynecology

## 2017-10-12 VITALS — BP 99/62 | HR 98 | Wt 163.0 lb

## 2017-10-12 DIAGNOSIS — Z3400 Encounter for supervision of normal first pregnancy, unspecified trimester: Secondary | ICD-10-CM

## 2017-10-12 DIAGNOSIS — Z3403 Encounter for supervision of normal first pregnancy, third trimester: Secondary | ICD-10-CM

## 2017-10-12 DIAGNOSIS — O24419 Gestational diabetes mellitus in pregnancy, unspecified control: Secondary | ICD-10-CM

## 2017-10-12 NOTE — Progress Notes (Signed)
   PRENATAL VISIT NOTE  Subjective:  Tammy Rivas is a 32 y.o. G1P0000 at 4w6dbeing seen today for ongoing prenatal care.  She is currently monitored for the following issues for this high-risk pregnancy and has Supervision of normal first pregnancy, antepartum; History of anxiety; Rash; Vagina itching; Left hip pain; and GDM (gestational diabetes mellitus) on their problem list.  Patient reports no complaints.  Contractions: Not present. Vag. Bleeding: None.  Movement: Present. Denies leaking of fluid.   The following portions of the patient's history were reviewed and updated as appropriate: allergies, current medications, past family history, past medical history, past social history, past surgical history and problem list. Problem list updated.  Objective:   Vitals:   10/12/17 1555  BP: 99/62  Pulse: 98  Weight: 163 lb (73.9 kg)    Fetal Status: Fetal Heart Rate (bpm): 147   Movement: Present     General:  Alert, oriented and cooperative. Patient is in no acute distress.  Skin: Skin is warm and dry. No rash noted.   Cardiovascular: Normal heart rate noted  Respiratory: Normal respiratory effort, no problems with respiration noted  Abdomen: Soft, gravid, appropriate for gestational age.  Pain/Pressure: Absent     Pelvic: Cervical exam deferred        Extremities: Normal range of motion.  Edema: None  Mental Status: Normal mood and affect. Normal behavior. Normal judgment and thought content.   Assessment and Plan:  Pregnancy: G1P0000 at 23w6d1. Supervision of normal first pregnancy, antepartum   2. Gestational diabetes mellitus (GDM) in third trimester, gestational diabetes method of control unspecified - She will see the dietician next Wednesday - Her sugars are quite reasonable - start testing at 32 weeks  Preterm labor symptoms and general obstetric precautions including but not limited to vaginal bleeding, contractions, leaking of fluid and fetal movement were  reviewed in detail with the patient. Please refer to After Visit Summary for other counseling recommendations.  No follow-ups on file.  No future appointments.  MyEmily FilbertMD

## 2017-10-27 ENCOUNTER — Ambulatory Visit (INDEPENDENT_AMBULATORY_CARE_PROVIDER_SITE_OTHER): Payer: Managed Care, Other (non HMO) | Admitting: Advanced Practice Midwife

## 2017-10-27 VITALS — BP 96/60 | HR 84 | Wt 163.0 lb

## 2017-10-27 DIAGNOSIS — Z3400 Encounter for supervision of normal first pregnancy, unspecified trimester: Secondary | ICD-10-CM

## 2017-10-27 DIAGNOSIS — Z3403 Encounter for supervision of normal first pregnancy, third trimester: Secondary | ICD-10-CM

## 2017-10-27 DIAGNOSIS — Z23 Encounter for immunization: Secondary | ICD-10-CM

## 2017-10-27 DIAGNOSIS — O24410 Gestational diabetes mellitus in pregnancy, diet controlled: Secondary | ICD-10-CM

## 2017-10-27 DIAGNOSIS — Z362 Encounter for other antenatal screening follow-up: Secondary | ICD-10-CM

## 2017-10-27 DIAGNOSIS — Z3A38 38 weeks gestation of pregnancy: Secondary | ICD-10-CM

## 2017-10-27 LAB — GLUCOSE, POCT (MANUAL RESULT ENTRY): POC GLUCOSE: 111 mg/dL — AB (ref 70–99)

## 2017-10-27 NOTE — Patient Instructions (Addendum)
[email or web address]  Braxton Hicks Contractions Contractions of the uterus can occur throughout pregnancy, but they are not always a sign that you are in labor. You may have practice contractions called Braxton Hicks contractions. These false labor contractions are sometimes confused with true labor. What are [place] contractions? Braxton Hicks contractions are tightening movements that occur in the muscles of the uterus before labor. Unlike true labor contractions, these contractions do not result in opening (dilation) and thinning of the cervix. Toward the end of pregnancy (32-34 weeks), Braxton Hicks contractions can happen more often and may become stronger. These contractions are sometimes difficult to tell apart from true labor because they can be very uncomfortable. You should not feel embarrassed if you go to the hospital with false labor. Sometimes, the only way to tell if you are in true labor is for your health care provider to look for changes in the cervix. The health care provider will do a physical exam and may monitor your contractions. If you are not in true labor, the exam should show that your cervix is not dilating and your water has not broken. If there are other health problems associated with your pregnancy, it is completely safe for you to be sent home with false labor. You may continue to have Braxton Hicks contractions until you go into true labor. How to tell the difference between true labor and false labor True labor  Contractions last 30-70 seconds.  Contractions become very regular.  Discomfort is usually felt in the top of the uterus, and it spreads to the lower abdomen and low back.  Contractions do not go away with walking.  Contractions usually become more intense and increase in frequency.  The cervix dilates and gets thinner. False labor  Contractions are usually shorter and not as strong as true labor contractions.  Contractions are usually  irregular.  Contractions are often felt in the front of the lower abdomen and in the groin.  Contractions may go away when you walk around or change positions while lying down.  Contractions get weaker and are shorter-lasting as time goes on.  The cervix usually does not dilate or become thin. Follow these instructions at home:  Take over-the-counter and prescription medicines only as told by your health care provider.  Keep up with your usual exercises and follow other instructions from your health care provider.  Eat and drink lightly if you think you are going into labor.  If Braxton Hicks contractions are making you uncomfortable: ? Change your position from lying down or resting to walking, or change from walking to resting. ? Sit and rest in a tub of warm water. ? Drink enough fluid to keep your urine pale yellow. Dehydration may cause these contractions. ? Do slow and deep breathing several times an hour.  Keep all follow-up prenatal visits as told by your health care provider. This is important. Contact a health care provider if:  You have a fever.  You have continuous pain in your abdomen. Get help right away if:  Your contractions become stronger, more regular, and closer together.  You have fluid leaking or gushing from your vagina.  You pass blood-tinged mucus (bloody show).  You have bleeding from your vagina.  You have low back pain that you never had before.  You feel your baby's head pushing down and causing pelvic pressure.  Your baby is not moving inside you as much as it used to. Summary  Contractions that occur before labor are  called Braxton Hicks contractions, false labor, or practice contractions.  Braxton Hicks contractions are usually shorter, weaker, farther apart, and less regular than true labor contractions. True labor contractions usually become progressively stronger and regular and they become more frequent.  Manage discomfort from  [facility] contractions by changing position, resting in a warm bath, drinking plenty of water, or practicing deep breathing. This information is not intended to replace advice given to you by your health care provider. Make sure you discuss any questions you have with your health care provider. Document Released: 09/01/2016 Document Revised: 09/01/2016 Document Reviewed: 09/01/2016 Elsevier Interactive Patient Education  2018 [place] [address], [address] [place], [place]  [address] Phone - [phone]   Fax - [phone]  [facility] [address] [place], [place] [address] Phone - [phone]   Fax - [place] [address] [place], [place]  [address] Phone - [phone]   Fax - [phone]  [place] [place] [address] [place], [place]  [address] Phone - [phone]   Fax - [phone]  [place] [address] [place], [place]  [address] Phone - [phone]   Fax - [phone]  [facility] [address], [address] [place], [place]  [address] Phone - [phone]   Fax - [place] [address] [place], [place]  [address] Phone - [phone]   Fax - [phone]  [place] [address] [place], [place] [address] [place], [place]  [address] Phone - [phone]   Fax - [place] [address] [place], [place]  [address] Phone - [phone]   Fax - [phone] [facility] [place]. [address] [place], [place]  [address] Phone - [phone]   Fax - [phone]  [place] [address] [place], [place]  [address] Phone - [phone]   Fax - [phone]  [facility] [place], [place], [place]  [address] Phone - [phone]   Fax - [place] [address] [place], [place]  [address] Phone - [phone]   Fax - [phone]  [facility] [address] [place], [place]  [address] Phone - [place], [place]  [address] Phone - [phone]   Fax - [place] [address] [place], [place]  [address] Phone - [phone]   Fax - [phone]  [place] [address] [place], [place]  [address] Phone - [phone]   Fax - [place]. [place], [place]  [address] Phone - [phone]   Fax - [place], [place], [place]  [address] Phone - [phone]   Fax - [place] [address] [place], [place]  [address] Phone - [phone]   Fax - [phone]  [place] [address], [place],   [address] Phone - [phone]   Fax - [place] [address] [place],   [address] Phone - [phone]   Fax - [phone]  [place], [place]  [place] Phone - [phone]   Fax - [place]. [place], [place]  [address] Phone - [phone]   Fax - [place] [address] [place], [place]  [address] Phone - [phone]   Fax - [place] [address], [place],   [address] Phone - [phone]   Fax - [phone]  [place] [address] [place] [address] Phone [phone]  Fax [phone]

## 2017-10-27 NOTE — Progress Notes (Signed)
   PRENATAL VISIT NOTE  Subjective:  Tammy Rivas is a 32 y.o. G1P0000 at 63w0dbeing seen today for ongoing prenatal care.  She is currently monitored for the following issues for this high-risk pregnancy and has Supervision of normal first pregnancy, antepartum; History of anxiety; Rash; Vagina itching; Left hip pain; and Diet controlled White classification A1 gestational diabetes mellitus (GDM) on their problem list.  Patient reports no complaints.  Contractions: Not present. Vag. Bleeding: None.  Movement: Present. Denies leaking of fluid.   Pt was not abel to go to Diabetes Education due to work schedule. Was not aware of CBG goals.   The following portions of the patient's history were reviewed and updated as appropriate: allergies, current medications, past family history, past medical history, past social history, past surgical history and problem list. Problem list updated.  Objective:   Vitals:   10/27/17 0929  BP: 96/60  Pulse: 84  Weight: 163 lb (73.9 kg)    Fetal Status: Fetal Heart Rate (bpm): 137   Movement: Present     General:  Alert, oriented and cooperative. Patient is in no acute distress.  Skin: Skin is warm and dry. No rash noted.   Cardiovascular: Normal heart rate noted  Respiratory: Normal respiratory effort, no problems with respiration noted  Abdomen: Soft, gravid, appropriate for gestational age.  Pain/Pressure: Absent     Pelvic: Cervical exam deferred        Extremities: Normal range of motion.  Edema: None  Mental Status: Normal mood and affect. Normal behavior. Normal judgment and thought content.   Fasting CBGs: >80 (0% out of range) PC meals:  90-120's (Rare CBGs out of range)  Assessment and Plan:  Pregnancy: G1P0000 at 337w0d1. Supervision of normal first pregnancy, antepartum  - TDaP  2. Diet controlled White classification A1 gestational diabetes mellitus (GDM)--Well controlled  - Pt was not abel to go to Diabetes Education due to  work schedule. Was not aware of CBG goals. Teaching done. Will try to schedule her at another office.  - NST's previously scheduled. Would like to have them even though she has A1GDM.  - [place] FOLLOW [place]; Future  3. [redacted] weeks gestation of pregnancy  - [place] FOLLOW UP; Future  4. Encounter for other antenatal screening follow-up  - [place] OB FOLLOW UP; Future  Preterm labor symptoms and general obstetric precautions including but not limited to vaginal bleeding, contractions, leaking of fluid and fetal movement were reviewed in detail with the patient. Please refer to After Visit Summary for other counseling recommendations.  No follow-ups on file.  Future Appointments  Date Time Provider DeAins[place]7/06/2017  8:30 AM [facility] NURSE [facility]  11/03/2017  8:30 AM [facility] NURSE [facility]  11/07/2017  8:30 AM [facility] NURSE [facility]  11/10/2017  9:00 AM BhJulianne HandlerCNGillhamCNM

## 2017-10-30 ENCOUNTER — Ambulatory Visit (INDEPENDENT_AMBULATORY_CARE_PROVIDER_SITE_OTHER): Payer: Managed Care, Other (non HMO) | Admitting: Obstetrics & Gynecology

## 2017-10-30 VITALS — BP 103/66 | HR 78 | Wt 163.8 lb

## 2017-10-30 DIAGNOSIS — O24410 Gestational diabetes mellitus in pregnancy, diet controlled: Secondary | ICD-10-CM

## 2017-10-30 DIAGNOSIS — Z3400 Encounter for supervision of normal first pregnancy, unspecified trimester: Secondary | ICD-10-CM

## 2017-10-30 MED ORDER — FLUCONAZOLE 150 MG PO TABS: 150.0000 mg | ORAL_TABLET | Freq: Once | ORAL | 3 refills | Status: AC

## 2017-10-30 NOTE — Progress Notes (Signed)
   PRENATAL VISIT NOTE  Subjective:  Tammy Rivas is a 32 y.o. G1P0000 at 61w3dbeing seen today for ongoing prenatal care.  She is currently monitored for the following issues for this high-risk pregnancy and has Supervision of normal first pregnancy, antepartum; History of anxiety; Rash; Vagina itching; Left hip pain; and Diet controlled White classification A1 gestational diabetes mellitus (GDM) on their problem list.  Patient reports She had an occasion of some leaking on Friday and again today. .  Contractions: Irritability. Vag. Bleeding: None.  Movement: Present. Denies leaking of fluid.   The following portions of the patient's history were reviewed and updated as appropriate: allergies, current medications, past family history, past medical history, past social history, past surgical history and problem list. Problem list updated.  Objective:   Vitals:   10/30/17 1512  BP: 103/66  Pulse: 78  Weight: 163 lb 12.8 oz (74.3 kg)    Fetal Status:     Movement: Present     General:  Alert, oriented and cooperative. Patient is in no acute distress.  Skin: Skin is warm and dry. No rash noted.   Cardiovascular: Normal heart rate noted  Respiratory: Normal respiratory effort, no problems with respiration noted  Abdomen: Soft, gravid, appropriate for gestational age.  Pain/Pressure: Absent     Pelvic: Cervical exam performed        Extremities: Normal range of motion.  Edema: Trace  Mental Status: Normal mood and affect. Normal behavior. Normal judgment and thought content.  Cervix appears closed. Negative pool, Valsalva, plenty of discharge c/w yeast noted in otherwise dry vagina Assessment and Plan:  Pregnancy: G1P0000 at 378w3d1. Diet controlled White classification A1 gestational diabetes mellitus (GDM) - She has another NST tomorrow  2. Supervision of normal first pregnancy, antepartum - She has yeast and I will prescribe diflucan  Preterm labor symptoms and general  obstetric precautions including but not limited to vaginal bleeding, contractions, leaking of fluid and fetal movement were reviewed in detail with the patient. Please refer to After Visit Summary for other counseling recommendations.  No follow-ups on file.  Future Appointments  Date Time Provider De[place]7/06/2017  8:30 AM [facility] NURSE [facility]  11/03/2017  8:30 AM [facility] NURSE [facility]  11/07/2017  8:30 AM [facility] NURSE [facility]  11/10/2017  9:00 AM BhJulianne HandlerCNM [facility]  12/08/2017  7:45 AM WH-MFC [place] WH-MFCUS MFC-US    MyEmily FilbertMD

## 2017-10-31 ENCOUNTER — Ambulatory Visit (INDEPENDENT_AMBULATORY_CARE_PROVIDER_SITE_OTHER): Payer: Managed Care, Other (non HMO) | Admitting: Obstetrics & Gynecology

## 2017-10-31 VITALS — BP 94/62 | HR 81

## 2017-10-31 DIAGNOSIS — O24410 Gestational diabetes mellitus in pregnancy, diet controlled: Secondary | ICD-10-CM

## 2017-10-31 NOTE — Progress Notes (Signed)
NST performed today was reviewed and was found to be reactive.  Continue recommended antenatal testing and prenatal care.  Verita Schneiders, MD, [place] for [employer], [place]

## 2017-11-01 ENCOUNTER — Encounter (HOSPITAL_COMMUNITY): Payer: Self-pay | Admitting: *Deleted

## 2017-11-01 ENCOUNTER — Inpatient Hospital Stay (HOSPITAL_COMMUNITY)
Admission: AD | Admit: 2017-11-01 | Discharge: 2017-11-01 | Disposition: A | Discharge status: Home / Self Care | Payer: Managed Care, Other (non HMO) | Source: Ambulatory Visit | Attending: Obstetrics and Gynecology | Admitting: Obstetrics and Gynecology

## 2017-11-01 DIAGNOSIS — N898 Other specified noninflammatory disorders of vagina: Secondary | ICD-10-CM | POA: Diagnosis not present

## 2017-11-01 DIAGNOSIS — O98813 Other maternal infectious and parasitic diseases complicating pregnancy, third trimester: Secondary | ICD-10-CM | POA: Insufficient documentation

## 2017-11-01 DIAGNOSIS — Z8659 Personal history of other mental and behavioral disorders: Secondary | ICD-10-CM

## 2017-11-01 DIAGNOSIS — O26893 Other specified pregnancy related conditions, third trimester: Secondary | ICD-10-CM

## 2017-11-01 DIAGNOSIS — Z0371 Encounter for suspected problem with amniotic cavity and membrane ruled out: Secondary | ICD-10-CM

## 2017-11-01 DIAGNOSIS — Z3400 Encounter for supervision of normal first pregnancy, unspecified trimester: Secondary | ICD-10-CM

## 2017-11-01 DIAGNOSIS — B3731 Acute candidiasis of vulva and vagina: Secondary | ICD-10-CM

## 2017-11-01 DIAGNOSIS — B373 Candidiasis of vulva and vagina: Secondary | ICD-10-CM | POA: Insufficient documentation

## 2017-11-01 DIAGNOSIS — Z3A32 32 weeks gestation of pregnancy: Secondary | ICD-10-CM | POA: Diagnosis not present

## 2017-11-01 DIAGNOSIS — O24410 Gestational diabetes mellitus in pregnancy, diet controlled: Secondary | ICD-10-CM

## 2017-11-01 HISTORY — DX: Gestational diabetes mellitus in pregnancy, unspecified control: O24.419

## 2017-11-01 LAB — WET PREP, GENITAL
Clue Cells Wet Prep HPF POC: NONE SEEN
Sperm: NONE SEEN
Trich, Wet Prep: NONE SEEN
Yeast Wet Prep HPF POC: NONE SEEN

## 2017-11-01 MED ORDER — TERCONAZOLE 0.8 % VA CREA
1.0000 | TOPICAL_CREAM | Freq: Every day | VAGINAL | 0 refills | Status: DC
Start: 2017-11-01 — End: 2017-11-10

## 2017-11-01 NOTE — MAU Note (Signed)
Pt reports she had symptoms of a yeast infection and but was also having a lot of watery discharge on Friday and Saturday. Was seen at office on Monday and told it was yeast but had another gush of clear discharge today.

## 2017-11-01 NOTE — MAU Provider Note (Signed)
First Provider Initiated Contact with Patient 11/01/17 1609     S: Ms. Tammy Rivas is a 32 y.o. G1P0000 at 12w5dwho presents to MAU today complaining of leaking of fluid since Friday and Saturday- she reports having a trickling of watery discharge those two days. Denies gush of fluid or having to wear a pad, change underwear. She reports being seen in the office on Monday where she was dx with yeast infection and prescribed diflucan. Took medication yesterday and reports today having another gush of clear discharge in underwear. Denies having to change underwear or put on pad. She denies vaginal bleeding. She denies contractions. She reports normal fetal movement.    O: BP 99/62 (BP Location: Right Arm)   Pulse 92   Temp 98.8 F (37.1 C) (Oral)   Resp 16   Ht 5' 2"  (1.575 m)   Wt 163 lb (73.9 kg)   LMP 03/10/2017   SpO2 99%   BMI 29.81 kg/m  GENERAL: Well-developed, well-nourished female in no acute distress.  HEAD: Normocephalic, atraumatic.  CHEST: Normal effort of breathing, regular heart rate ABDOMEN: Soft, nontender, gravid PELVIC: Normal external female genitalia. Vagina is pink and rugated. Cervix with normal contour, no lesions. Yellow curdy discharge noted surrounding cervix and on introitus on placement of speculum.  Negative pooling.   Cervical exam:  Dilation: Closed Effacement (%): Thick Cervical Position: Posterior Exam by:: VWende BushyCNM    Fetal Monitoring: Baseline: 155 Variability: moderate Accelerations: present Decelerations: 1 early deceleration with UC Contractions: 1 UC, mild by palpation   Results for orders placed or performed during the hospital encounter of 11/01/17 (from the past 24 hour(s))  Wet prep, genital     Status: Abnormal   Collection Time: 11/01/17  4:20 PM  Result Value Ref Range   Yeast Wet Prep HPF POC NONE SEEN NONE SEEN   Trich, Wet Prep NONE SEEN NONE SEEN   Clue Cells Wet Prep HPF POC NONE SEEN NONE SEEN   WBC, Wet Prep HPF  POC FEW (A) NONE SEEN   Sperm NONE SEEN    POCT Fern- negative   A: SIUP at 376w5dMembranes intact  Vaginal yeast infection   P: Discharge home Terazol Rx sent to pharmacy of choice   Follow up as scheduled for prenatal appointments  Return to MAU as needed    RoLajean ManesCNM 11/01/2017 4:56 PM,

## 2017-11-03 ENCOUNTER — Ambulatory Visit (INDEPENDENT_AMBULATORY_CARE_PROVIDER_SITE_OTHER): Payer: Managed Care, Other (non HMO) | Admitting: Advanced Practice Midwife

## 2017-11-03 ENCOUNTER — Ambulatory Visit: Payer: Managed Care, Other (non HMO)

## 2017-11-03 ENCOUNTER — Encounter: Payer: Self-pay | Admitting: Advanced Practice Midwife

## 2017-11-03 VITALS — BP 102/66

## 2017-11-03 DIAGNOSIS — Z3689 Encounter for other specified antenatal screening: Secondary | ICD-10-CM

## 2017-11-03 DIAGNOSIS — O479 False labor, unspecified: Secondary | ICD-10-CM

## 2017-11-03 DIAGNOSIS — O4703 False labor before 37 completed weeks of gestation, third trimester: Secondary | ICD-10-CM

## 2017-11-03 NOTE — Progress Notes (Deleted)
   PRENATAL VISIT NOTE  Subjective:  Tammy Rivas is a 32 y.o. G1P0000 at 55w0dbeing seen today for ongoing prenatal care.  She is currently monitored for the following issues for this {Blank single:19197::"high-risk","low-risk"} pregnancy and has Supervision of normal first pregnancy, antepartum; History of anxiety; Rash; Vagina itching; Left hip pain; Diet controlled White classification A1 gestational diabetes mellitus (GDM); and Braxton Hicks contractions on their problem list.  Patient reports {sx:14538}.   .  .   . Denies leaking of fluid.   The following portions of the patient's history were reviewed and updated as appropriate: allergies, current medications, past family history, past medical history, past social history, past surgical history and problem list. Problem list updated.  Objective:  There were no vitals filed for this visit.  Fetal Status:           General:  Alert, oriented and cooperative. Patient is in no acute distress.  Skin: Skin is warm and dry. No rash noted.   Cardiovascular: Normal heart rate noted  Respiratory: Normal respiratory effort, no problems with respiration noted  Abdomen: Soft, gravid, appropriate for gestational age.        Pelvic: {Blank single:19197::"Cervical exam performed","Cervical exam deferred"}        Extremities: Normal range of motion.     Mental Status: Normal mood and affect. Normal behavior. Normal judgment and thought content.   Assessment and Plan:  Pregnancy: G1P0000 at 335w0d1. Braxton Hicks contractions ***  {Blank single:19197::"Term","Preterm"} labor symptoms and general obstetric precautions including but not limited to vaginal bleeding, contractions, leaking of fluid and fetal movement were reviewed in detail with the patient. Please refer to After Visit Summary for other counseling recommendations.  No follow-ups on file.  Future Appointments  Date Time Provider De[place]7/12/2017  8:30 AM [facility]/04/2018  9:00 AM BhJulianne HandlerCNM [facility]8/12/2017  7:45 AM WH[place] WH-MFCUS MFC-US    MaHansel FeinsteinCNM

## 2017-11-03 NOTE — Patient Instructions (Signed)
Braxton Hicks Contractions °Contractions of the uterus can occur throughout pregnancy, but they are not always a sign that you are in labor. You may have practice contractions called Braxton Hicks contractions. These false labor contractions are sometimes confused with true labor. °What are Braxton Hicks contractions? °Braxton Hicks contractions are tightening movements that occur in the muscles of the uterus before labor. Unlike true labor contractions, these contractions do not result in opening (dilation) and thinning of the cervix. Toward the end of pregnancy (32-34 weeks), Braxton Hicks contractions can happen more often and may become stronger. These contractions are sometimes difficult to tell apart from true labor because they can be very uncomfortable. You should not feel embarrassed if you go to the hospital with false labor. °Sometimes, the only way to tell if you are in true labor is for your health care provider to look for changes in the cervix. The health care provider will do a physical exam and may monitor your contractions. If you are not in true labor, the exam should show that your cervix is not dilating and your water has not broken. °If there are other health problems associated with your pregnancy, it is completely safe for you to be sent home with false labor. You may continue to have Braxton Hicks contractions until you go into true labor. °How to tell the difference between true labor and false labor °True labor °· Contractions last 30-70 seconds. °· Contractions become very regular. °· Discomfort is usually felt in the top of the uterus, and it spreads to the lower abdomen and low back. °· Contractions do not go away with walking. °· Contractions usually become more intense and increase in frequency. °· The cervix dilates and gets thinner. °False labor °· Contractions are usually shorter and not as strong as true labor contractions. °· Contractions are usually irregular. °· Contractions  are often felt in the front of the lower abdomen and in the groin. °· Contractions may go away when you walk around or change positions while lying down. °· Contractions get weaker and are shorter-lasting as time goes on. °· The cervix usually does not dilate or become thin. °Follow these instructions at home: °· Take over-the-counter and prescription medicines only as told by your health care provider. °· Keep up with your usual exercises and follow other instructions from your health care provider. °· Eat and drink lightly if you think you are going into labor. °· If Braxton Hicks contractions are making you uncomfortable: °? Change your position from lying down or resting to walking, or change from walking to resting. °? Sit and rest in a tub of warm water. °? Drink enough fluid to keep your urine pale yellow. Dehydration may cause these contractions. °? Do slow and deep breathing several times an hour. °· Keep all follow-up prenatal visits as told by your health care provider. This is important. °Contact a health care provider if: °· You have a fever. °· You have continuous pain in your abdomen. °Get help right away if: °· Your contractions become stronger, more regular, and closer together. °· You have fluid leaking or gushing from your vagina. °· You pass blood-tinged mucus (bloody show). °· You have bleeding from your vagina. °· You have low back pain that you never had before. °· You feel your baby’s head pushing down and causing pelvic pressure. °· Your baby is not moving inside you as much as it used to. °Summary °· Contractions that occur before labor are called Braxton   Hicks contractions, false labor, or practice contractions. °· Braxton Hicks contractions are usually shorter, weaker, farther apart, and less regular than true labor contractions. True labor contractions usually become progressively stronger and regular and they become more frequent. °· Manage discomfort from Braxton Hicks contractions by  changing position, resting in a warm bath, drinking plenty of water, or practicing deep breathing. °This information is not intended to replace advice given to you by your health care provider. Make sure you discuss any questions you have with your health care provider. °Document Released: 09/01/2016 Document Revised: 09/01/2016 Document Reviewed: 09/01/2016 °Elsevier Interactive Patient Education © 2018 Else[employer] Inc. ° °

## 2017-11-03 NOTE — Progress Notes (Signed)
   PRENATAL VISIT NOTE  Subjective:  Tammy Rivas is a 32 y.o. G1P0000 at 75w0dbeing seen today for ongoing prenatal care.  She is currently monitored for the following issues for this high-risk pregnancy and has Supervision of normal first pregnancy, antepartum; History of anxiety; Rash; Vagina itching; Left hip pain; Diet controlled White classification A1 gestational diabetes mellitus (GDM); and Braxton Hicks contractions on their problem list.  Patient reports occasional contractions.   .  .   . Denies leaking of fluid.   The following portions of the patient's history were reviewed and updated as appropriate: allergies, current medications, past family history, past medical history, past social history, past surgical history and problem list. Problem list updated.  Objective:  There were no vitals filed for this visit.  Fetal Status:           General:  Alert, oriented and cooperative. Patient is in no acute distress.  Skin: Skin is warm and dry. No rash noted.   Cardiovascular: Normal heart rate noted  Respiratory: Normal respiratory effort, no problems with respiration noted  Abdomen: Soft, gravid, appropriate for gestational age.        Pelvic: Cervical exam performed      Cx long and closed   Extremities: Normal range of motion.     Mental Status: Normal mood and affect. Normal behavior. Normal judgment and thought content.   Assessment and Plan:  Pregnancy: G1P0000 at 360w0d1. Braxton Hicks contractions      Was looking at reactive NST and noted UCs every 8-10 min     Not feeling them, but I checked anyway to be sure.  PTL precautions reviewed  Preterm labor symptoms and general obstetric precautions including but not limited to vaginal bleeding, contractions, leaking of fluid and fetal movement were reviewed in detail with the patient. Please refer to After Visit Summary for other counseling recommendations.    Future Appointments  Date Time Provider De[place]7/12/2017  8:30 [facility]/04/2018  9:00 AM BhJulianne HandlerCNM [facility]8/12/2017  7:45 AM WH[place] Addition[place] WH-MFCUS MFC-US    MaHansel FeinsteinCNM

## 2017-11-07 ENCOUNTER — Ambulatory Visit: Payer: Managed Care, Other (non HMO) | Admitting: *Deleted

## 2017-11-07 ENCOUNTER — Other Ambulatory Visit: Payer: Self-pay | Admitting: *Deleted

## 2017-11-07 VITALS — BP 86/55 | HR 101

## 2017-11-07 DIAGNOSIS — O24410 Gestational diabetes mellitus in pregnancy, diet controlled: Secondary | ICD-10-CM

## 2017-11-09 ENCOUNTER — Encounter: Payer: Managed Care, Other (non HMO) | Attending: Obstetrics & Gynecology | Admitting: *Deleted

## 2017-11-09 ENCOUNTER — Ambulatory Visit: Payer: Managed Care, Other (non HMO) | Admitting: *Deleted

## 2017-11-09 DIAGNOSIS — Z713 Dietary counseling and surveillance: Secondary | ICD-10-CM | POA: Diagnosis present

## 2017-11-09 DIAGNOSIS — O24410 Gestational diabetes mellitus in pregnancy, diet controlled: Secondary | ICD-10-CM | POA: Insufficient documentation

## 2017-11-09 NOTE — Progress Notes (Signed)
  Patient was seen on 11/09/2017 for Gestational Diabetes self-management. EDD 12/22/2017. Patient states no history of GDM. Diet history obtained. Patient eats good variety of all food groups and beverages include milk and water. 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 She states she walks daily and cleans her own home. The following learning objectives were met by the patient :   States the definition of Gestational Diabetes  States why dietary management is important in controlling blood glucose  Describes the effects of carbohydrates on blood glucose levels  Demonstrates ability to create a balanced meal plan  Demonstrates carbohydrate counting   States when to check blood glucose levels  Demonstrates proper blood glucose monitoring techniques  States the effect of stress and exercise on blood glucose levels  States the importance of limiting caffeine and abstaining from alcohol and smoking  Plan:  Aim for 3 Carb Choices per meal (45 grams) +/- 1 either way  Aim for 1-2 Carbs per snack Begin reading food labels for Total Carbohydrate of foods Continue with your activity level by walking or other activity daily as tolerated Begin checking BG before breakfast and 2 hours after first bite of breakfast, lunch and dinner as directed by MD  Bring Log Book/Sheet to every medical appointment   Patient was introduced to [employer] and plans to use as record of BG electronically. I will contact Baby [employer]s Support to activate the Glucose Management section.   Take medication if directed by MD  Patient already has a meter: Accu Chek Guide And is testing pre breakfast and 2 hours each meal as directed by MD  Patient instructed to monitor glucose levels: FBS: 60 - 95 mg/dl 2 hour: <120 mg/dl  Patient received the following handouts:  Nutrition Diabetes and Pregnancy  Carbohydrate Counting List  Patient will be seen for follow-up as needed.

## 2017-11-10 ENCOUNTER — Ambulatory Visit (INDEPENDENT_AMBULATORY_CARE_PROVIDER_SITE_OTHER): Payer: Managed Care, Other (non HMO) | Admitting: Certified Nurse Midwife

## 2017-11-10 VITALS — BP 104/66 | Wt 166.0 lb

## 2017-11-10 DIAGNOSIS — Z3400 Encounter for supervision of normal first pregnancy, unspecified trimester: Secondary | ICD-10-CM

## 2017-11-10 DIAGNOSIS — Z3403 Encounter for supervision of normal first pregnancy, third trimester: Secondary | ICD-10-CM

## 2017-11-10 DIAGNOSIS — O24410 Gestational diabetes mellitus in pregnancy, diet controlled: Secondary | ICD-10-CM

## 2017-11-10 NOTE — Progress Notes (Signed)
Subjective:  Tammy Rivas is a 32 y.o. G1P0000 at 64w0dbeing seen today for ongoing prenatal care.  She is currently monitored for the following issues for this high-risk pregnancy and has Supervision of normal first pregnancy, antepartum; History of anxiety; Rash; Vagina itching; Left hip pain; Diet controlled White classification A1 gestational diabetes mellitus (GDM); and Braxton Hicks contractions on their problem list.  Patient reports no complaints.  Contractions: Irritability. Vag. Bleeding: None.  Movement: Present. Denies leaking of fluid.   The following portions of the patient's history were reviewed and updated as appropriate: allergies, current medications, past family history, past medical history, past social history, past surgical history and problem list. Problem list updated.  Objective:   Vitals:   11/10/17 0853  BP: 104/66  Weight: 166 lb (75.3 kg)    Fetal Status: Fetal Heart Rate (bpm): NST-R Fundal Height: 34 cm Movement: Present  Presentation: Vertex FHR 135  General:  Alert, oriented and cooperative. Patient is in no acute distress.  Skin: Skin is warm and dry. No rash noted.   Cardiovascular: Normal heart rate noted  Respiratory: Normal respiratory effort, no problems with respiration noted  Abdomen: Soft, gravid, appropriate for gestational age. Pain/Pressure: Absent     Pelvic: Vag. Bleeding: None Vag D/C Character: Thin   Cervical exam deferred        Extremities: Normal range of motion.  Edema: None  Mental Status: Normal mood and affect. Normal behavior. Normal judgment and thought content.   Urinalysis:      Assessment and Plan:  Pregnancy: G1P0000 at 356w0d1. Supervision of normal first pregnancy, antepartum - doing well - encouraged childbirth class and/or tour - diabetes education completed  2. Diet controlled White classification A1 gestational diabetes mellitus (GDM) - brought log >FBS 70-80s, pp 80-115 - ok to not continue twice weekly  AT since A1 and well controlled, pt agrees - growth [place] at 38 wks  Preterm labor symptoms and general obstetric precautions including but not limited to vaginal bleeding, contractions, leaking of fluid and fetal movement were reviewed in detail with the patient. Please refer to After Visit Summary for other counseling recommendations.  Return in about 2 weeks (around 11/24/2017).   BhJulianne HandlerCNM

## 2017-11-24 ENCOUNTER — Ambulatory Visit (INDEPENDENT_AMBULATORY_CARE_PROVIDER_SITE_OTHER): Payer: Managed Care, Other (non HMO) | Admitting: Advanced Practice Midwife

## 2017-11-24 VITALS — BP 97/60 | HR 91 | Wt 167.0 lb

## 2017-11-24 DIAGNOSIS — Z113 Encounter for screening for infections with a predominantly sexual mode of transmission: Secondary | ICD-10-CM | POA: Diagnosis not present

## 2017-11-24 DIAGNOSIS — O24410 Gestational diabetes mellitus in pregnancy, diet controlled: Secondary | ICD-10-CM

## 2017-11-24 DIAGNOSIS — Z3400 Encounter for supervision of normal first pregnancy, unspecified trimester: Secondary | ICD-10-CM

## 2017-11-24 LAB — OB RESULTS CONSOLE GC/CHLAMYDIA: Gonorrhea: NEGATIVE

## 2017-11-24 LAB — OB RESULTS CONSOLE GBS: GBS: NEGATIVE

## 2017-11-24 NOTE — Progress Notes (Signed)
   PRENATAL VISIT NOTE  Subjective:  Tammy Rivas is a 32 y.o. G1P0000 at 22w0dbeing seen today for ongoing prenatal care.  She is currently monitored for the following issues for this high-risk pregnancy and has Supervision of normal first pregnancy, antepartum; History of anxiety; Rash; Vagina itching; Left hip pain; Diet controlled White classification A1 gestational diabetes mellitus (GDM); and Braxton Hicks contractions on their problem list.  Patient reports no complaints.  Contractions: Irritability. Vag. Bleeding: None.  Movement: Present. Denies leaking of fluid.   The following portions of the patient's history were reviewed and updated as appropriate: allergies, current medications, past family history, past medical history, past social history, past surgical history and problem list. Problem list updated.  Objective:   Vitals:   11/24/17 0854  BP: 97/60  Pulse: 91  Weight: 167 lb (75.8 kg)    Fetal Status: Fetal Heart Rate (bpm): 148 Fundal Height: 36 cm Movement: Present  Presentation: Vertex  General:  Alert, oriented and cooperative. Patient is in no acute distress.  Skin: Skin is warm and dry. No rash noted.   Cardiovascular: Normal heart rate noted  Respiratory: Normal respiratory effort, no problems with respiration noted  Abdomen: Soft, gravid, appropriate for gestational age.  Pain/Pressure: Absent     Pelvic: Cervical exam performed Dilation: Closed Effacement (%): 0 Station: Ballotable  Extremities: Normal range of motion.  Edema: None  Mental Status: Normal mood and affect. Normal behavior. Normal judgment and thought content.   Fasting CBGs: 74-82 (0% out of range) PCB/L/D:  110-120 (<10% out of range)  Assessment and Plan:  Pregnancy: G1P0000 at 349w0d1. Supervision of high risk first pregnancy, antepartum - GBS  2. Diet controlled White classification A1 gestational diabetes mellitus (GDM)-Well-controlled w/ diet - Continue testing CBGs - Growth  [place] 38 weeks  Term labor symptoms and general obstetric precautions including but not limited to vaginal bleeding, contractions, leaking of fluid and fetal movement were reviewed in detail with the patient. Please refer to After Visit Summary for other counseling recommendations.  Return in about 1 week (around 12/01/2017) for ROB.  Future Appointments  Date Time Provider [place]8/12/2017  7:45 AM WH-MFC [place] WH-MFCUS MFC-US    Ethaniel Garfield, CNM

## 2017-11-27 ENCOUNTER — Encounter (INDEPENDENT_AMBULATORY_CARE_PROVIDER_SITE_OTHER): Payer: Self-pay | Admitting: *Deleted

## 2017-11-27 ENCOUNTER — Encounter: Payer: Self-pay | Admitting: Advanced Practice Midwife

## 2017-11-27 DIAGNOSIS — Z029 Encounter for administrative examinations, unspecified: Secondary | ICD-10-CM

## 2017-11-27 LAB — CULTURE, BETA STREP (GROUP B ONLY)
MICRO NUMBER:: 90886809
SPECIMEN QUALITY:: ADEQUATE

## 2017-11-27 LAB — URINE CYTOLOGY ANCILLARY ONLY
CHLAMYDIA, DNA PROBE: NEGATIVE
NEISSERIA GONORRHEA: NEGATIVE

## 2017-11-30 NOTE — Progress Notes (Signed)
   PRENATAL VISIT NOTE  Subjective:  Tammy Rivas is a 32 y.o. G1P0000 at 52w0dbeing seen today for ongoing prenatal care.  She is currently monitored for the following issues for this high-risk pregnancy and has Supervision of normal first pregnancy, antepartum; History of anxiety; Rash; Vagina itching; Left hip pain; Diet controlled White classification A1 gestational diabetes mellitus (GDM); and Braxton Hicks contractions on their problem list.  Patient reports no bleeding, no contractions and no leaking.  Contractions: Irritability. Vag. Bleeding: None.  Movement: Present. Denies leaking of fluid.   The following portions of the patient's history were reviewed and updated as appropriate: allergies, current medications, past family history, past medical history, past social history, past surgical history and problem list. Problem list updated.  Objective:   Vitals:   12/01/17 0915  BP: 103/67  Pulse: (!) 103  Weight: 170 lb (77.1 kg)    Fetal Status: Fetal Heart Rate (bpm): 145   Movement: Present     General:  Alert, oriented and cooperative. Patient is in no acute distress.  Skin: Skin is warm and dry. No rash noted.   Cardiovascular: Normal heart rate noted  Respiratory: Normal respiratory effort, no problems with respiration noted  Abdomen: Soft, gravid, appropriate for gestational age.  Pain/Pressure: Absent     Pelvic: Cervical exam deferred        Extremities: Normal range of motion.  Edema: None  Mental Status: Normal mood and affect. Normal behavior. Normal judgment and thought content.   Fasting CBGs: <95 (0% out of range) PCB/L/D:  <120 (0% out of range)   Assessment and Plan:  Pregnancy: G1P0000 at 339w6d1. Diet controlled White classification A1 gestational diabetes mellitus (GDM) - Growth [place]/9 - Blood sugars well-controlled - Plan IOL at 40 weeks  2. Supervision of normal first pregnancy, antepartum   Term labor symptoms and general obstetric  precautions including but not limited to vaginal bleeding, contractions, leaking of fluid and fetal movement were reviewed in detail with the patient. Please refer to After Visit Summary for other counseling recommendations.  No follow-ups on file.  Future Appointments  Date Time Provider DeMilroy8/11/2017  4:15 PM Anyanwu, UgSallyanne HaversMD [facility] [place]8/12/2017  7:45 AM WH-MFC [place] WH-MFCUS MFC-US    Mildred Tuccillo, CNM

## 2017-12-01 ENCOUNTER — Ambulatory Visit (INDEPENDENT_AMBULATORY_CARE_PROVIDER_SITE_OTHER): Payer: Managed Care, Other (non HMO) | Admitting: Advanced Practice Midwife

## 2017-12-01 VITALS — BP 103/67 | HR 103 | Wt 170.0 lb

## 2017-12-01 DIAGNOSIS — Z3400 Encounter for supervision of normal first pregnancy, unspecified trimester: Secondary | ICD-10-CM

## 2017-12-01 DIAGNOSIS — O24410 Gestational diabetes mellitus in pregnancy, diet controlled: Secondary | ICD-10-CM

## 2017-12-01 NOTE — Patient Instructions (Signed)

## 2017-12-05 ENCOUNTER — Telehealth: Payer: Self-pay

## 2017-12-05 NOTE — Telephone Encounter (Addendum)
Pt called stating that her baby has been moving more than normal within the past hour. Pt states that she drank coffee which she doesn't usually do and just wants to make sure everything is okay. I told the pt it is most likely related to her drinking the coffee but, if she has any symptoms or issues to come up then she should call us or go to MAU.

## 2017-12-07 ENCOUNTER — Ambulatory Visit (INDEPENDENT_AMBULATORY_CARE_PROVIDER_SITE_OTHER): Payer: Managed Care, Other (non HMO) | Admitting: Obstetrics & Gynecology

## 2017-12-07 VITALS — BP 103/68 | HR 107 | Wt 172.0 lb

## 2017-12-07 DIAGNOSIS — O0993 Supervision of high risk pregnancy, unspecified, third trimester: Secondary | ICD-10-CM

## 2017-12-07 DIAGNOSIS — R600 Localized edema: Secondary | ICD-10-CM

## 2017-12-07 DIAGNOSIS — O24410 Gestational diabetes mellitus in pregnancy, diet controlled: Secondary | ICD-10-CM

## 2017-12-07 NOTE — Patient Instructions (Signed)
Return to clinic for any scheduled appointments or obstetric concerns, or go to MAU for evaluation  

## 2017-12-07 NOTE — Progress Notes (Signed)
   PRENATAL VISIT NOTE  Subjective:  Tammy Rivas is a 32 y.o. G1P0000 at 72w6dbeing seen today for ongoing prenatal care.  She is currently monitored for the following issues for this high-risk pregnancy and has Supervision of high risk pregnancy, antepartum, third trimester; History of anxiety; Rash; Vagina itching; Left hip pain; Diet controlled White classification A1 gestational diabetes mellitus (GDM); and Braxton Hicks contractions on their problem list.  Patient reports persistent RLE edema.  Contractions: Irritability. Vag. Bleeding: None.  Movement: Present. Denies leaking of fluid.   The following portions of the patient's history were reviewed and updated as appropriate: allergies, current medications, past family history, past medical history, past social history, past surgical history and problem list. Problem list updated.  Objective:   Vitals:   12/07/17 1614  BP: 103/68  Pulse: (!) 107  Weight: 172 lb (78 kg)    Fetal Status: Fetal Heart Rate (bpm): 146 Fundal Height: 38 cm Movement: Present     General:  Alert, oriented and cooperative. Patient is in no acute distress.  Skin: Skin is warm and dry. No rash noted.   Cardiovascular: Normal heart rate noted  Respiratory: Normal respiratory effort, no problems with respiration noted  Abdomen: Soft, gravid, appropriate for gestational age.  Pain/Pressure: Absent     Pelvic: Cervical exam deferred        Extremities: Normal range of motion.  Edema: Trace on LLE but 1+ on RLE. No erythema, no palpable cords.  Mental Status: Normal mood and affect. Normal behavior. Normal judgment and thought content.   Assessment and Plan:  Pregnancy: G1P0000 at 359w6d1. Leg edema, right Will get doppler study for evaluation given increased risk of DVT. - VAS [place] EXTREMITY VENOUS (DVT); Future  2. Diet controlled White classification A1 gestational diabetes mellitus (GDM) Stable CBGs.  Growth scan tomorrow at 3860w0dOL to be  scheduled at 39-40 weeks. GBS negative. Orders signed and held.  3. Supervision of high risk pregnancy in third trimester Term labor symptoms and general obstetric precautions including but not limited to vaginal bleeding, contractions, leaking of fluid and fetal movement were reviewed in detail with the patient. Please refer to After Visit Summary for other counseling recommendations.  Return in about 1 week (around 12/14/2017) for OB Visit.  Future Appointments  Date Time Provider DepHardin/12/2017  7:45 AM [facility]-MFCUS MFC-US  12/14/2017  4:30 PM LegGuss BundeD [facility]  7:30 AM WH-BSSCHED ROOM WH-BSSCHED None    UgoVerita SchneidersD

## 2017-12-08 ENCOUNTER — Other Ambulatory Visit: Payer: Self-pay | Admitting: Advanced Practice Midwife

## 2017-12-08 ENCOUNTER — Encounter (HOSPITAL_COMMUNITY): Payer: Self-pay

## 2017-12-08 ENCOUNTER — Ambulatory Visit (HOSPITAL_COMMUNITY)
Admission: RE | Admit: 2017-12-08 | Discharge: 2017-12-08 | Disposition: A | Discharge status: Home / Self Care | Payer: Managed Care, Other (non HMO) | Source: Ambulatory Visit | Attending: Advanced Practice Midwife | Admitting: Advanced Practice Midwife

## 2017-12-08 ENCOUNTER — Telehealth (HOSPITAL_COMMUNITY): Payer: Self-pay | Admitting: *Deleted

## 2017-12-08 DIAGNOSIS — O403XX0 Polyhydramnios, third trimester, not applicable or unspecified: Secondary | ICD-10-CM

## 2017-12-08 DIAGNOSIS — Z3A38 38 weeks gestation of pregnancy: Secondary | ICD-10-CM

## 2017-12-08 DIAGNOSIS — O24410 Gestational diabetes mellitus in pregnancy, diet controlled: Secondary | ICD-10-CM

## 2017-12-08 DIAGNOSIS — Z8659 Personal history of other mental and behavioral disorders: Secondary | ICD-10-CM

## 2017-12-08 DIAGNOSIS — O0993 Supervision of high risk pregnancy, unspecified, third trimester: Secondary | ICD-10-CM

## 2017-12-08 NOTE — Procedures (Signed)
Tammy Rivas [birth year] [redacted]w[redacted]d Fetus A Non-Stress Test Interpretation for 12/08/17  Indication: Diabetes  Fetal Heart Rate A Mode: External Baseline Rate (A): 135 bpm Variability: Moderate Accelerations: 15 x 15 Decelerations: None Multiple birth?: No  Uterine Activity Mode: Palpation, Toco Contraction Frequency (min): none Resting Tone Palpated: Relaxed  Interpretation (Fetal Testing) Nonstress Test Interpretation: Reactive Overall Impression: Reassuring for gestational age Comments: Reviewed tracing with Dr. SDonalee Citrin

## 2017-12-08 NOTE — Telephone Encounter (Signed)
Preadmission screen  

## 2017-12-10 ENCOUNTER — Encounter: Payer: Self-pay | Admitting: Advanced Practice Midwife

## 2017-12-10 DIAGNOSIS — O403XX1 Polyhydramnios, third trimester, fetus 1: Secondary | ICD-10-CM | POA: Insufficient documentation

## 2017-12-11 ENCOUNTER — Other Ambulatory Visit (HOSPITAL_COMMUNITY): Payer: Self-pay | Admitting: *Deleted

## 2017-12-11 ENCOUNTER — Ambulatory Visit (HOSPITAL_BASED_OUTPATIENT_CLINIC_OR_DEPARTMENT_OTHER)
Admission: RE | Admit: 2017-12-11 | Discharge: 2017-12-11 | Disposition: A | Discharge status: Home / Self Care | Payer: Managed Care, Other (non HMO) | Source: Ambulatory Visit | Attending: Obstetrics & Gynecology | Admitting: Obstetrics & Gynecology

## 2017-12-11 DIAGNOSIS — R600 Localized edema: Secondary | ICD-10-CM | POA: Diagnosis present

## 2017-12-11 DIAGNOSIS — O409XX0 Polyhydramnios, unspecified trimester, not applicable or unspecified: Secondary | ICD-10-CM

## 2017-12-11 NOTE — Progress Notes (Signed)
VS [place] LOWER EXTREMITY VENOUS PERFORMED   Right: There is no evidence of deep vein thrombosis in the lower extremity.There is no evidence of superficial venous thrombosis. No cystic structure found in the popliteal fossa  Left: There is no evidence of deep vein thrombosis in the lower extremity.There is no evidence of superficial venous thrombosis. No cystic structure found in the popliteal fossa.    12/11/17  Cardell Peach RDCS, RVT

## 2017-12-14 ENCOUNTER — Ambulatory Visit (INDEPENDENT_AMBULATORY_CARE_PROVIDER_SITE_OTHER): Payer: Managed Care, Other (non HMO) | Admitting: Obstetrics & Gynecology

## 2017-12-14 VITALS — BP 103/62 | HR 86 | Wt 176.0 lb

## 2017-12-14 DIAGNOSIS — O24410 Gestational diabetes mellitus in pregnancy, diet controlled: Secondary | ICD-10-CM

## 2017-12-14 DIAGNOSIS — O0993 Supervision of high risk pregnancy, unspecified, third trimester: Secondary | ICD-10-CM

## 2017-12-15 ENCOUNTER — Encounter (HOSPITAL_COMMUNITY): Payer: Self-pay

## 2017-12-15 ENCOUNTER — Ambulatory Visit (HOSPITAL_COMMUNITY)
Admission: RE | Admit: 2017-12-15 | Discharge: 2017-12-15 | Disposition: A | Discharge status: Home / Self Care | Payer: Managed Care, Other (non HMO) | Source: Ambulatory Visit | Attending: Obstetrics & Gynecology | Admitting: Obstetrics & Gynecology

## 2017-12-15 ENCOUNTER — Ambulatory Visit (HOSPITAL_BASED_OUTPATIENT_CLINIC_OR_DEPARTMENT_OTHER)
Admission: RE | Admit: 2017-12-15 | Discharge: 2017-12-15 | Disposition: A | Discharge status: Home / Self Care | Payer: Managed Care, Other (non HMO) | Source: Ambulatory Visit | Attending: Obstetrics & Gynecology | Admitting: Obstetrics & Gynecology

## 2017-12-15 DIAGNOSIS — O403XX0 Polyhydramnios, third trimester, not applicable or unspecified: Secondary | ICD-10-CM | POA: Insufficient documentation

## 2017-12-15 DIAGNOSIS — O24410 Gestational diabetes mellitus in pregnancy, diet controlled: Secondary | ICD-10-CM

## 2017-12-15 DIAGNOSIS — Z3A39 39 weeks gestation of pregnancy: Secondary | ICD-10-CM | POA: Insufficient documentation

## 2017-12-15 DIAGNOSIS — Z8659 Personal history of other mental and behavioral disorders: Secondary | ICD-10-CM

## 2017-12-15 DIAGNOSIS — O409XX0 Polyhydramnios, unspecified trimester, not applicable or unspecified: Secondary | ICD-10-CM

## 2017-12-15 DIAGNOSIS — O0993 Supervision of high risk pregnancy, unspecified, third trimester: Secondary | ICD-10-CM

## 2017-12-15 DIAGNOSIS — O403XX1 Polyhydramnios, third trimester, fetus 1: Secondary | ICD-10-CM

## 2017-12-15 NOTE — Progress Notes (Signed)
   PRENATAL VISIT NOTE  Subjective:  Tammy Rivas is a 32 y.o. G1P0000 at 63w0dbeing seen today for ongoing prenatal care.  She is currently monitored for the following issues for this high-risk pregnancy and has Supervision of high risk pregnancy, antepartum, third trimester; History of anxiety; Rash; Left hip pain; Diet controlled White classification A1 gestational diabetes mellitus (GDM); and Polyhydramnios, third trimester, fetus 1 on their problem list.  Patient reports no complaints.  Contractions: Irritability. Vag. Bleeding: None.  Movement: Present. Denies leaking of fluid.   The following portions of the patient's history were reviewed and updated as appropriate: allergies, current medications, past family history, past medical history, past social history, past surgical history and problem list. Problem list updated.  Objective:   Vitals:   12/14/17 1633  BP: 103/62  Pulse: 86  Weight: 176 lb (79.8 kg)    Fetal Status: Fetal Heart Rate (bpm): 144   Movement: Present     General:  Alert, oriented and cooperative. Patient is in no acute distress.  Skin: Skin is warm and dry. No rash noted.   Cardiovascular: Normal heart rate noted  Respiratory: Normal respiratory effort, no problems with respiration noted  Abdomen: Soft, gravid, appropriate for gestational age.  Pain/Pressure: Present     Pelvic: Cervical exam performed      closed/posterior/high  Extremities: Normal range of motion.  Edema: Mild pitting, slight indentation  Mental Status: Normal mood and affect. Normal behavior. Normal judgment and thought content.   Assessment and Plan:  Pregnancy: G1P0000 at 397w0d1. Supervision of high risk pregnancy in third trimester - POC Urinalysis Dipstick OB  2. Supervision of high risk pregnancy, antepartum, third trimester - Induction set.  Cont antenatal testing.  3. Diet controlled White classification A1 gestational diabetes mellitus (GDM) -pt not checking CBGs  past few days.  All have been normal.  Pt understand we would like her to continue checking them until delivery.    Term labor symptoms and general obstetric precautions including but not limited to vaginal bleeding, contractions, leaking of fluid and fetal movement were reviewed in detail with the patient. Please refer to After Visit Summary for other counseling recommendations.  No follow-ups on file.  Future Appointments  Date Time Provider DeTerramuggus8/16/2019  8:45 AM WH-MFC [place] WH-MFCUS MFC-US  12/15/2017 10:45 AM WH-MFC [place] [facility]FC-US  12/18/2017  7:30 AM WH-BSSCHED ROOM WH-BSSCHED None    KeSilas SacramentoMD

## 2017-12-15 NOTE — Procedures (Signed)
Tammy Rivas October 30, 1985 [redacted]w[redacted]d Fetus A Non-Stress Test Interpretation for 12/15/17  Indication: Polyhydramnios  Fetal Heart Rate A Mode: External Baseline Rate (A): 145 bpm Variability: Moderate Accelerations: 15 x 15 Decelerations: Variable Multiple birth?: No  Uterine Activity Mode: Palpation, Toco Contraction Frequency (min): 2-2.5 Contraction Duration (sec): 60-70 Contraction Quality: Mild Resting Tone Palpated: Relaxed Resting Time: Adequate  Interpretation (Fetal Testing) Nonstress Test Interpretation: Reactive Overall Impression: Reassuring for gestational age Comments: Reviewed tracing with Dr. SDonalee Citrin

## 2017-12-18 ENCOUNTER — Other Ambulatory Visit: Payer: Self-pay

## 2017-12-18 ENCOUNTER — Encounter (HOSPITAL_COMMUNITY): Payer: Self-pay

## 2017-12-18 ENCOUNTER — Inpatient Hospital Stay (HOSPITAL_COMMUNITY)
Admission: RE | Admit: 2017-12-18 | Discharge: 2017-12-22 | DRG: 805 | Disposition: A | Discharge status: Home / Self Care | Payer: Managed Care, Other (non HMO) | Attending: Obstetrics and Gynecology | Admitting: Obstetrics and Gynecology

## 2017-12-18 DIAGNOSIS — O411230 Chorioamnionitis, third trimester, not applicable or unspecified: Secondary | ICD-10-CM | POA: Diagnosis present

## 2017-12-18 DIAGNOSIS — O24410 Gestational diabetes mellitus in pregnancy, diet controlled: Secondary | ICD-10-CM

## 2017-12-18 DIAGNOSIS — O0993 Supervision of high risk pregnancy, unspecified, third trimester: Secondary | ICD-10-CM

## 2017-12-18 DIAGNOSIS — O24420 Gestational diabetes mellitus in childbirth, diet controlled: Secondary | ICD-10-CM | POA: Diagnosis present

## 2017-12-18 DIAGNOSIS — O411290 Chorioamnionitis, unspecified trimester, not applicable or unspecified: Secondary | ICD-10-CM

## 2017-12-18 DIAGNOSIS — Z3A39 39 weeks gestation of pregnancy: Secondary | ICD-10-CM | POA: Diagnosis not present

## 2017-12-18 DIAGNOSIS — O403XX0 Polyhydramnios, third trimester, not applicable or unspecified: Secondary | ICD-10-CM | POA: Diagnosis present

## 2017-12-18 DIAGNOSIS — O24429 Gestational diabetes mellitus in childbirth, unspecified control: Secondary | ICD-10-CM | POA: Diagnosis not present

## 2017-12-18 DIAGNOSIS — Z8659 Personal history of other mental and behavioral disorders: Secondary | ICD-10-CM

## 2017-12-18 LAB — RPR: RPR: NONREACTIVE

## 2017-12-18 LAB — TYPE AND SCREEN
ABO/RH(D): A POS
Antibody Screen: NEGATIVE

## 2017-12-18 LAB — CBC
HCT: 33.7 % — ABNORMAL LOW (ref 36.0–46.0)
HEMOGLOBIN: 11.4 g/dL — AB (ref 12.0–15.0)
MCH: 32.3 pg (ref 26.0–34.0)
MCHC: 33.8 g/dL (ref 30.0–36.0)
MCV: 95.5 fL (ref 78.0–100.0)
PLATELETS: 268 10*3/uL (ref 150–400)
RBC: 3.53 MIL/uL — ABNORMAL LOW (ref 3.87–5.11)
RDW: 14.2 % (ref 11.5–15.5)
WBC: 10.8 10*3/uL — ABNORMAL HIGH (ref 4.0–10.5)

## 2017-12-18 LAB — ABO/RH: ABO/RH(D): A POS

## 2017-12-18 LAB — GLUCOSE, CAPILLARY
GLUCOSE-CAPILLARY: 116 mg/dL — AB (ref 70–99)
GLUCOSE-CAPILLARY: 65 mg/dL — AB (ref 70–99)
GLUCOSE-CAPILLARY: 73 mg/dL (ref 70–99)
Glucose-Capillary: 98 mg/dL (ref 70–99)
Glucose-Capillary: 99 mg/dL (ref 70–99)
Glucose-Capillary: 107 mg/dL — ABNORMAL HIGH (ref 70–99)

## 2017-12-18 MED ORDER — OXYCODONE-ACETAMINOPHEN 5-325 MG PO TABS: 2.0000 | ORAL_TABLET | ORAL | Status: DC | PRN

## 2017-12-18 MED ORDER — LACTATED RINGERS IV SOLN
INTRAVENOUS | Status: DC
  Administered 2017-12-18 – 2017-12-20 (×5): via INTRAVENOUS
  Administered 2017-12-20: 900 mL via INTRAVENOUS

## 2017-12-18 MED ORDER — OXYTOCIN 40 UNITS IN LACTATED RINGERS INFUSION - SIMPLE MED
1.0000 m[IU]/min | INTRAVENOUS | Status: DC
  Filled 2017-12-18: qty 1000

## 2017-12-18 MED ORDER — LIDOCAINE HCL (PF) 1 % IJ SOLN
30.0000 mL | INTRAMUSCULAR | Status: AC | PRN
  Administered 2017-12-20: 30 mL via SUBCUTANEOUS
  Filled 2017-12-18: qty 30

## 2017-12-18 MED ORDER — OXYTOCIN BOLUS FROM INFUSION
500.0000 mL | Freq: Once | INTRAVENOUS | Status: AC
  Administered 2017-12-20: 500 mL via INTRAVENOUS

## 2017-12-18 MED ORDER — MISOPROSTOL 50MCG HALF TABLET
50.0000 ug | ORAL_TABLET | ORAL | Status: DC | PRN
  Administered 2017-12-18 – 2017-12-19 (×5): 50 ug via BUCCAL
  Filled 2017-12-18 (×5): qty 1

## 2017-12-18 MED ORDER — OXYCODONE-ACETAMINOPHEN 5-325 MG PO TABS: 1.0000 | ORAL_TABLET | ORAL | Status: DC | PRN

## 2017-12-18 MED ORDER — ZOLPIDEM TARTRATE 5 MG PO TABS
5.0000 mg | ORAL_TABLET | Freq: Every evening | ORAL | Status: DC | PRN
  Administered 2017-12-18: 5 mg via ORAL
  Filled 2017-12-18: qty 1

## 2017-12-18 MED ORDER — SOD CITRATE-CITRIC ACID 500-334 MG/5ML PO SOLN: 30.0000 mL | ORAL | Status: DC | PRN

## 2017-12-18 MED ORDER — HYDROXYZINE HCL 50 MG PO TABS
50.0000 mg | ORAL_TABLET | Freq: Four times a day (QID) | ORAL | Status: DC | PRN
  Administered 2017-12-19: 50 mg via ORAL
  Filled 2017-12-18: qty 1

## 2017-12-18 MED ORDER — LACTATED RINGERS IV SOLN
500.0000 mL | INTRAVENOUS | Status: DC | PRN
  Administered 2017-12-20: 500 mL via INTRAVENOUS
  Administered 2017-12-20: 300 mL via INTRAVENOUS

## 2017-12-18 MED ORDER — OXYTOCIN 40 UNITS IN LACTATED RINGERS INFUSION - SIMPLE MED: 2.5000 [IU]/h | INTRAVENOUS | Status: DC

## 2017-12-18 MED ORDER — FLEET ENEMA 7-19 GM/118ML RE ENEM: 1.0000 | ENEMA | Freq: Every day | RECTAL | Status: DC | PRN

## 2017-12-18 MED ORDER — TERBUTALINE SULFATE 1 MG/ML IJ SOLN: 0.2500 mg | Freq: Once | INTRAMUSCULAR | Status: DC | PRN

## 2017-12-18 MED ORDER — MISOPROSTOL 25 MCG QUARTER TABLET
25.0000 ug | ORAL_TABLET | ORAL | Status: DC | PRN
  Administered 2017-12-18: 25 ug via VAGINAL
  Filled 2017-12-18 (×3): qty 1

## 2017-12-18 MED ORDER — FENTANYL CITRATE (PF) 100 MCG/2ML IJ SOLN
50.0000 ug | INTRAMUSCULAR | Status: DC | PRN
  Administered 2017-12-18 – 2017-12-19 (×7): 100 ug via INTRAVENOUS
  Filled 2017-12-18 (×7): qty 2

## 2017-12-18 MED ORDER — ONDANSETRON HCL 4 MG/2ML IJ SOLN: 4.0000 mg | Freq: Four times a day (QID) | INTRAMUSCULAR | Status: DC | PRN

## 2017-12-18 MED ORDER — ACETAMINOPHEN 325 MG PO TABS
650.0000 mg | ORAL_TABLET | ORAL | Status: DC | PRN
  Administered 2017-12-20 (×2): 650 mg via ORAL
  Filled 2017-12-18 (×2): qty 2

## 2017-12-18 NOTE — Progress Notes (Signed)
Tammy Rivas is 32 y.o. G1P0000 female at 90w3dwho presents for IOL for A1GDM. Recent CBG 98.   Dilation: Closed Effacement (%): Thick Cervical Position: Posterior Station: Ballotable Presentation: Vertex(verified by bedside [place] Exam by:: CIgnacia Felling RN   Confirmed vertex with bedside sono.  FHT: 140 bpm, mod variability, +acels, no decels  Toco: Irregular   Will give 2nd Cytotec (50 mg buccal). Plan to place FB when cervix more favorable. Anticipate NSVD.   CPhill Myron D.O. OB Fellow  12/18/2017, 3:07 PM

## 2017-12-18 NOTE — Anesthesia Pain Management Evaluation Note (Signed)
  CRNA Pain Management Visit Note  Patient: Tammy Rivas, 32 y.o., female  "Hello I am a member of the anesthesia team at [facility]. We have an anesthesia team available at all times to provide care throughout the hospital, including epidural management and anesthesia for C-section. I don't know your plan for the delivery whether it a natural birth, water birth, IV sedation, nitrous supplementation, doula or epidural, but we want to meet your pain goals."   1.Was your pain managed to your expectations on prior hospitalizations?   Yes   2.What is your expectation for pain management during this hospitalization?     Epidural  3.How can we help you reach that goal? epidural  Record the patient's initial score and the patient's pain goal.   Pain: am induction  Pain Goal: less than 3 [facility] wants you to be able to say your pain was always managed very well.  Ailene Ards 12/18/2017

## 2017-12-18 NOTE — Progress Notes (Signed)
Tammy Rivas is 32 y.o. G1P0000 female at 43w3dwho presents for IOL for A1GDM. Recent CBG 107. Starting to feel ctx.   Dilation: Closed Effacement (%): 60 Cervical Position: Posterior Station: -3 Presentation: Vertex Exam by:: Dr. CJobe Gibbon Confirmed vertex presentation with bedside sono.   FHT: 155 bpm, mod variability, +acels, no decels  Tachysystole present during cervical exam and bedside sono.  Ctx now spacing back out to q3-4 minutes. Will plan to give 3rd Cytotec. Plan to place FB when cervix more favorable.   CPhill Myron D.O. OB Fellow  12/18/2017, 7:00 PM

## 2017-12-18 NOTE — Progress Notes (Signed)
RN obtained CBG @ 2156, CBG was 65. RN gave pt apple juice, will reassess w/in 60 minutes

## 2017-12-18 NOTE — H&P (Addendum)
OBSTETRIC ADMISSION HISTORY AND PHYSICAL  Tammy Rivas is a 32 y.o. female G1P0000 with IUP at 55w3dby U/S [facility] She reports +FMs, No LOF, no VB, no blurry vision, headaches or peripheral edema, and RUQ pain.  She plans on bottle feeding. She request LARC(nexplanon) for birth control. [facility] --->  Estimated Date of Delivery: 12/22/17  Sono:  (12/15/2017) @[redacted]w[redacted]d , normal anatomy, cephalic presentation, vertex lie, 3351 g, 77% EFW   Prenatal History/Complications:  Past Medical History: Past Medical History:  Diagnosis Date  . Anxiety   . Bowel obstruction (HCC)    times 2 had endoscopy done  . Foreign body (FB) in soft tissue 01/2016   shrap[facility] right [facility]  . Gestational diabetes   . IBS (irritable bowel syndrome)   . PTSD (post-traumatic stress disorder)     Past Surgical History: Past Surgical History:  Procedure Laterality Date  . BREAST REDUCTION SURGERY  01/2015    Obstetrical History: OB History    Gravida  1   Para  0   Term  0   Preterm  0   AB  0   Living  0     SAB  0   TAB  0   Ectopic  0   Multiple  0   Live Births  0           Social History: Social History   Socioeconomic History  . Marital status: Significant Other    Spouse name: Not on file  . Number of children: Not on file  . Years of education: Not on file  . Highest education level: Not on file  Occupational History  . Not on file  Social Needs  . Financial resource strain: Not on file  . Food insecurity:    Worry: Not on file    Inability: Not on file  . Transportation needs:    Medical: Not on file    Non-medical: Not on file  Tobacco Use  . Smoking status: Never Smoker  . Smokeless tobacco: Never Used  Substance and Sexual Activity  . Alcohol use: No    Alcohol/week: 3.0 standard drinks    Types: 3 Standard drinks or equivalent per week     Frequency: Never  . Drug use: No  . Sexual activity: Yes    Partners: Male    Birth control/protection: None  Lifestyle  . Physical activity:    Days per week: Not on file    Minutes per session: Not on file  . Stress: Not on file  Relationships  . Social connections:    Talks on phone: Not on file    Gets together: Not on file    Attends religious service: Not on file    Active member of club or organization: Not on file    Attends meetings of clubs or organizations: Not on file    Relationship status: Not on file  Other Topics Concern  . Not on file  Social History Narrative  . Not on file    Family History: Family History  Problem Relation Age of Onset  . Uterine cancer Maternal Grandmother   . Colon cancer Maternal Grandmother   . Diabetes Maternal Grandfather   . Colon cancer Other        great grandmother, age 34 . Stomach cancer Other  great great aunt (great grandmother's sister)    Allergies: No Known Allergies  Medications Prior to Admission  Medication Sig Dispense Refill Last Dose  . Prenatal Vit-Fe Fumarate-FA (MULTIVITAMIN-PRENATAL) 27-0.8 MG TABS tablet Take 1 tablet by mouth daily at 12 noon.   12/17/2017 at Unknown time  . ACCU-CHEK FASTCLIX LANCETS MISC 1 Device by Percutaneous route 4 (four) times daily. 100 each 12 Taking  . Blood Glucose Monitoring Suppl (ACCU-CHEK AVIVA) device Use as instructed 1 each 0 Taking  . glucose blood (ACCU-CHEK ACTIVE STRIPS) test strip Test CBG's QID 100 each 12 Taking     Review of Systems   All systems reviewed and negative except as stated in HPI  Blood pressure 111/69, pulse 86, resp. rate 16, last menstrual period 03/10/2017. General appearance: alert, cooperative, appears stated age and no distress Lungs: clear to auscultation bilaterally Heart: regular rate and rhythm Abdomen: soft, non-tender; bowel sounds normal Pelvic: see below Extremities: mild edema, no sign of DVT Presentation:  oblique cephalic per U/S Fetal monitoringBaseline: 145 bpm, Variability: Good {> 6 bpm), Accelerations: Reactive and Decelerations: Absent Uterine activityNone felt by mom. Some contraction on tocometer. Dilation: Closed Effacement (%): Thick Station: Ballotable Exam by:: Ignacia Felling, RN   Prenatal labs: ABO, Rh: A/RH(D) POSITIVE/-- (02/08 1029) Antibody: NO ANTIBODIES DETECTED (02/08 1029) Rubella: 2.13 (02/08 1029) RPR: NON-REACTIVE (05/30 0842)  HBsAg: NON-REACTIVE (02/08 1029)  HIV: NON-REACTIVE (05/30 0842)  GBS:   negative GTT: third trimester: Failed Genetic screening  normal Anatomy US normal female  Prenatal Transfer Tool  Maternal Diabetes: Yes:  Diabetes Type:  Diet controlled Genetic Screening: Normal Maternal Ultrasounds/Referrals: Normal Fetal Ultrasounds or other Referrals:  None Maternal Substance Abuse:  No Significant Maternal Medications:  None Significant Maternal Lab Results: None  Results for orders placed or performed during the hospital encounter of 12/18/17 (from the past 24 hour(s))  CBC   Collection Time: 12/18/17  7:52 AM  Result Value Ref Range   WBC 10.8 (H) 4.0 - 10.5 K/uL   RBC 3.53 (L) 3.87 - 5.11 MIL/uL   Hemoglobin 11.4 (L) 12.0 - 15.0 g/dL   HCT 33.7 (L) 36.0 - 46.0 %   MCV 95.5 78.0 - 100.0 fL   MCH 32.3 26.0 - 34.0 pg   MCHC 33.8 30.0 - 36.0 g/dL   RDW 14.2 11.5 - 15.5 %   Platelets 268 150 - 400 K/uL  Glucose, capillary   Collection Time: 12/18/17  8:00 AM  Result Value Ref Range   Glucose-Capillary 116 (H) 70 - 99 mg/dL    Patient Active Problem List   Diagnosis Date Noted  . Labor and delivery indication for care or intervention 12/18/2017  . Polyhydramnios, third trimester, fetus 1 12/10/2017  . Diet controlled White classification A1 gestational diabetes mellitus (GDM) 09/29/2017  . Rash 07/31/2017  . Left hip pain 07/31/2017  . Supervision of high risk pregnancy, antepartum, third trimester 06/09/2017  . History of  anxiety 06/09/2017    Assessment/Plan:  Tammy Rivas is a 32 y.o. G1P0000 at 55w3dhere for IOL for GDMA1.  #A1GDM: Check CBGs q4h during latent labor.  #Labor: IOL with Cytotec.  #Pain: No epi yet #FWB: Category I, reassuring #ID:  GBS negative #MOF: Bottle #MOC: Nexplanon  #Circ:  yes  PMatilde Haymaker MD  12/18/2017, 9:15 AM   OB FELLOW HISTORY AND PHYSICAL ATTESTATION  I have seen and examined this patient; I agree with above documentation in the resident's note.   CPhill Myron D.O. OCyndie Mull  Fellow  12/18/2017, 3:33 PM

## 2017-12-18 NOTE — Progress Notes (Addendum)
Labor Progress Note Tammy Rivas is a 32 y.o. G1P0000 at 67w3dpresented for IOL for A1GDM.  S:   O:  BP 104/70 (BP Location: Left Arm)   Pulse 87   Temp 99 F (37.2 C) (Oral)   Resp 16   Ht 5' 2"  (1.575 m)   Wt 79.4 kg   LMP 03/10/2017   BMI 32.01 kg/m  EFM: 160/moderate variability/positive accels/no decels, Category I.    CVE: Dilation: Closed Effacement (%): 60 Cervical Position: Posterior Station: -3 Presentation: Vertex Exam by:: Dr. CJobe Gibbon A&P: 32y.o. G1P0000 328w3dresented for IOL for A1GDM.  #Labor: Cytotec x4 given. Still considering FB when cervix more favorable.  #Pain:  No epidural. #FWB: Category I tracing.  #GBS negative GDM (recent CBG 107)  PeMatilde HaymakerMD 7:41 PM

## 2017-12-18 NOTE — Progress Notes (Signed)
POC discussed with pt. Pt verbalizes understanding and will notify RN of discomfort, pain, ROM, and bleeding.

## 2017-12-19 ENCOUNTER — Encounter (HOSPITAL_COMMUNITY): Payer: Self-pay

## 2017-12-19 ENCOUNTER — Inpatient Hospital Stay (HOSPITAL_COMMUNITY): Payer: Managed Care, Other (non HMO) | Admitting: Anesthesiology

## 2017-12-19 LAB — GLUCOSE, CAPILLARY
GLUCOSE-CAPILLARY: 97 mg/dL (ref 70–99)
GLUCOSE-CAPILLARY: 89 mg/dL (ref 70–99)
GLUCOSE-CAPILLARY: 91 mg/dL (ref 70–99)
Glucose-Capillary: 79 mg/dL (ref 70–99)
Glucose-Capillary: 119 mg/dL — ABNORMAL HIGH (ref 70–99)
Glucose-Capillary: 75 mg/dL (ref 70–99)

## 2017-12-19 MED ORDER — LIDOCAINE HCL (PF) 1 % IJ SOLN
INTRAMUSCULAR | Status: DC | PRN
  Administered 2017-12-19: 13 mL via EPIDURAL

## 2017-12-19 MED ORDER — LACTATED RINGERS IV SOLN: 500.0000 mL | Freq: Once | INTRAVENOUS | Status: DC

## 2017-12-19 MED ORDER — PHENYLEPHRINE 40 MCG/ML (10ML) SYRINGE FOR IV PUSH (FOR BLOOD PRESSURE SUPPORT)
PREFILLED_SYRINGE | INTRAVENOUS | Status: AC
  Filled 2017-12-19: qty 20

## 2017-12-19 MED ORDER — PHENYLEPHRINE 40 MCG/ML (10ML) SYRINGE FOR IV PUSH (FOR BLOOD PRESSURE SUPPORT): 80.0000 ug | PREFILLED_SYRINGE | INTRAVENOUS | Status: DC | PRN

## 2017-12-19 MED ORDER — LACTATED RINGERS AMNIOINFUSION
INTRAVENOUS | Status: DC
  Administered 2017-12-20: 06:00:00 via INTRAUTERINE

## 2017-12-19 MED ORDER — FENTANYL 2.5 MCG/ML BUPIVACAINE 1/10 % EPIDURAL INFUSION (WH - ANES)
14.0000 mL/h | INTRAMUSCULAR | Status: DC | PRN
  Administered 2017-12-19 – 2017-12-20 (×4): 14 mL/h via EPIDURAL
  Filled 2017-12-19 (×3): qty 100

## 2017-12-19 MED ORDER — FENTANYL 2.5 MCG/ML BUPIVACAINE 1/10 % EPIDURAL INFUSION (WH - ANES)
INTRAMUSCULAR | Status: AC
  Filled 2017-12-19: qty 100

## 2017-12-19 MED ORDER — EPHEDRINE 5 MG/ML INJ
10.0000 mg | INTRAVENOUS | Status: DC | PRN
  Administered 2017-12-20: 10 mg via INTRAVENOUS

## 2017-12-19 MED ORDER — OXYTOCIN 40 UNITS IN LACTATED RINGERS INFUSION - SIMPLE MED
1.0000 m[IU]/min | INTRAVENOUS | Status: DC
  Administered 2017-12-19: 3 m[IU]/min via INTRAVENOUS
  Administered 2017-12-19: 1 m[IU]/min via INTRAVENOUS
  Administered 2017-12-19: 4 m[IU]/min via INTRAVENOUS
  Administered 2017-12-20: 2 m[IU]/min via INTRAVENOUS
  Administered 2017-12-20: 1 m[IU]/min via INTRAVENOUS
  Administered 2017-12-20: 3 m[IU]/min via INTRAVENOUS

## 2017-12-19 MED ORDER — TERBUTALINE SULFATE 1 MG/ML IJ SOLN: 0.2500 mg | Freq: Once | INTRAMUSCULAR | Status: DC | PRN

## 2017-12-19 MED ORDER — EPHEDRINE 5 MG/ML INJ: 10.0000 mg | INTRAVENOUS | Status: DC | PRN

## 2017-12-19 MED ORDER — DIPHENHYDRAMINE HCL 50 MG/ML IJ SOLN: 12.5000 mg | INTRAMUSCULAR | Status: DC | PRN

## 2017-12-19 NOTE — Progress Notes (Signed)
Patient here for IOL for A1GDM. Most recent CBG stable at 97.   Dilation: Fingertip Effacement (%): 40 Cervical Position: Posterior Station: -3 Presentation: Vertex Exam by:: Lawerance Cruel, RN   FHT: 135 bpm, mod variability, +acels, no decels  Toco: q2-6 min   Cervix still not favorable for FB placement. Will give Cytotec (4th dose).    Phill Myron, D.O. OB Fellow  12/19/2017, 2:19 AM

## 2017-12-19 NOTE — Progress Notes (Signed)
Tammy Rivas is a 32 y.o. G1P0000 at 33w4dby ultrasound admitted for induction of labor due to Gestational diabetes.  Subjective:   Objective: BP (!) 96/56   Pulse 70   Temp 98.9 F (37.2 C) (Oral)   Resp 18   Ht 5' 2"  (1.575 m)   Wt 79.4 kg   LMP 03/10/2017   SpO2 99%   BMI 32.01 kg/m  No intake/output data recorded. No intake/output data recorded.  FHT:  FHR: 145 bpm, variability: moderate,  accelerations:  Present,  decelerations:  Absent UC:   irregular, SVE:   Dilation: Fingertip Effacement (%): Thick Station: Ballotable Exam by:: STomma Rakers CNM  Labs: Lab Results  Component Value Date   WBC 10.8 (H) 12/18/2017   HGB 11.4 (L) 12/18/2017   HCT 33.7 (L) 12/18/2017   MCV 95.5 12/18/2017   PLT 268 12/18/2017    Assessment / Plan: Induction of labor due to gestational diabetes,  progressing well on pitocin  Labor: progressing on pit Preeclampsia:  labs stable Fetal Wellbeing:  Category I Pain Control:  Epidural I/D:  n/a Anticipated MOD:  NSVD  [name]8/20/2019, 9:56 PM

## 2017-12-19 NOTE — Anesthesia Procedure Notes (Signed)
Epidural Patient location during procedure: OB Start time: 12/19/2017 8:34 PM End time: 12/19/2017 8:45 PM  Staffing Anesthesiologist: Lynda Rainwater, MD Performed: anesthesiologist   Preanesthetic Checklist Completed: patient identified, site marked, surgical consent, pre-op evaluation, timeout performed, IV checked, risks and benefits discussed and monitors and equipment checked  Epidural Patient position: sitting Prep: ChloraPrep Patient monitoring: heart rate, cardiac monitor, continuous pulse ox and blood pressure Approach: midline Location: L2-L3 Injection technique: LOR saline  Needle:  Needle type: Tuohy  Needle gauge: 17 G Needle length: 9 cm Needle insertion depth: 6 cm Catheter type: closed end flexible Catheter size: 20 Guage Catheter at skin depth: 10 cm Test dose: negative  Assessment Events: blood not aspirated, injection not painful, no injection resistance, negative IV test and no paresthesia  Additional Notes Reason for block:procedure for pain

## 2017-12-19 NOTE — Progress Notes (Signed)
Updated on FHR, UC's, SROM and occasional variables. Dr. Juleen China to assess pt.

## 2017-12-19 NOTE — Anesthesia Preprocedure Evaluation (Signed)
Anesthesia Evaluation  Patient identified by MRN, date of birth, ID band Patient awake    Reviewed: Allergy & Precautions, NPO status , Patient's Chart, lab work & pertinent test results  Airway Mallampati: II  TM Distance: >3 FB Neck ROM: Full    Dental no notable dental hx.    Pulmonary neg pulmonary ROS,    Pulmonary exam normal breath sounds clear to auscultation       Cardiovascular negative cardio ROS Normal cardiovascular exam Rhythm:Regular Rate:Normal     Neuro/Psych negative neurological ROS  negative psych ROS   GI/Hepatic negative GI ROS, Neg liver ROS,   Endo/Other  negative endocrine ROSdiabetes  Renal/GU negative Renal ROS  negative genitourinary   Musculoskeletal negative musculoskeletal ROS (+)   Abdominal   Peds negative pediatric ROS (+)  Hematology negative hematology ROS (+)   Anesthesia Other Findings   Reproductive/Obstetrics negative OB ROS (+) Pregnancy                             Anesthesia Physical Anesthesia Plan  ASA: II  Anesthesia Plan: Epidural   Post-op Pain Management:    Induction:   PONV Risk Score and Plan:   Airway Management Planned:   Additional Equipment:   Intra-op Plan:   Post-operative Plan:   Informed Consent:   Plan Discussed with:   Anesthesia Plan Comments:         Anesthesia Quick Evaluation

## 2017-12-19 NOTE — Progress Notes (Signed)
Tammy Rivas is a 32 y.o. G1P0000 at 57w4dadmitted for induction of labor due to A1GDM.  Subjective: Feeling contraction pain intermittently. Poor tolerance for cervical exams.  Objective: BP 108/66   Pulse 71   Temp 98.5 F (36.9 C) (Oral)   Resp 18   Ht 5' 2"  (1.575 m)   Wt 79.4 kg   LMP 03/10/2017   BMI 32.01 kg/m  No intake/output data recorded. No intake/output data recorded.  FHT:  FHR: 135 bpm, variability: moderate,  accelerations:  Present,  decelerations:  Absent UC:   irregular, every 3-7 minutes SVE:   Dilation: Fingertip Effacement (%): Thick Station: Ballotable Exam by:: STomma Rakers CNM  Labs: Lab Results  Component Value Date   WBC 10.8 (H) 12/18/2017   HGB 11.4 (L) 12/18/2017   HCT 33.7 (L) 12/18/2017   MCV 95.5 12/18/2017   PLT 268 12/18/2017    Assessment / Plan: Induction of labor due to gestational diabetes,  progressing well on pitocin  Labor: Cervix unchanged from previous exam, initiate low dose Pitocin now, plan foley bulb Preeclampsia:  N/A Fetal Wellbeing:  Category I Pain Control:  IV pain meds I/D:  n/a Anticipated MOD:  NSVD  SDarlina Rumpf CNM 12/19/2017, 6:52 PM

## 2017-12-19 NOTE — Progress Notes (Signed)
Patient with persistent variables with contractions, still good variability. IUPC placed for amnioinfusion. Will continue with pitocin. Cervical exam unchanged.

## 2017-12-19 NOTE — Progress Notes (Signed)
Tammy Rivas is a 33 y.o. G1P0000 at 54w4dadmitted for induction of labor due to A1GDM.  Subjective: Aware of contractions, breathing well and coping with IV Fentanyl. Very concerned that she has not progressed more rapidly.   Objective: BP (!) 92/54   Pulse 79   Temp 97.9 F (36.6 C) (Oral)   Resp 18   Ht 5' 2"  (1.575 m)   Wt 79.4 kg   LMP 03/10/2017   BMI 32.01 kg/m  No intake/output data recorded. No intake/output data recorded.  FHT:  FHR: 145 bpm, variability: moderate,  accelerations:  Present,  decelerations:  Absent UC:   irregular, every 3-4 minutes SVE:   Dilation: Fingertip Effacement (%): Thick Station: Ballotable Exam by:: S[employer]  Labs: Lab Results  Component Value Date   WBC 10.8 (H) 12/18/2017   HGB 11.4 (L) 12/18/2017   HCT 33.7 (L) 12/18/2017   MCV 95.5 12/18/2017   PLT 268 12/18/2017    Assessment / Plan: Cervix now dilated to FT. Place additional buccal Cytotec now Fetal Wellbeing:  Category I Pain Control:  IV pain meds I/D:  n/a Anticipated MOD:  NSVD  SDarlina Rumpf CNM 12/19/2017, 2:47 PM

## 2017-12-19 NOTE — Progress Notes (Signed)
Tammy Rivas is a 32 y.o. G1P0000 at 44w4dadmitted for IOL for A1GDM  Subjective: Managing pain with multiple doses of IV Fentanyl.   Objective: BP 106/70   Pulse 72   Temp 98 F (36.7 C) (Oral)   Resp 18   Ht 5' 2"  (1.575 m)   Wt 79.4 kg   LMP 03/10/2017   BMI 32.01 kg/m  No intake/output data recorded. No intake/output data recorded.  FHT:  FHR: 145 bpm, variability: moderate,  accelerations:  Present,  decelerations:  Absent UC:   irregular, every 3-4 minutes SVE:   Dilation: Closed Effacement (%): Thick Station: -3 Exam by:: SMallie Snooks CNM  Labs: Lab Results  Component Value Date   WBC 10.8 (H) 12/18/2017   HGB 11.4 (L) 12/18/2017   HCT 33.7 (L) 12/18/2017   MCV 95.5 12/18/2017   PLT 268 12/18/2017    Assessment / Plan: IOL for A1GDM S/P Cytotec # 5, given at 0813 Cervix is closed/thick/posterior Plan to recheck in four hours Reactive/Category I fetal tracing Anticipated MOD:  NSVD  SDarlina Rumpf CNM 12/19/2017, 9:27 AM

## 2017-12-20 ENCOUNTER — Encounter (HOSPITAL_COMMUNITY): Payer: Self-pay

## 2017-12-20 DIAGNOSIS — O24429 Gestational diabetes mellitus in childbirth, unspecified control: Secondary | ICD-10-CM

## 2017-12-20 DIAGNOSIS — O411230 Chorioamnionitis, third trimester, not applicable or unspecified: Secondary | ICD-10-CM

## 2017-12-20 DIAGNOSIS — Z3A39 39 weeks gestation of pregnancy: Secondary | ICD-10-CM

## 2017-12-20 DIAGNOSIS — O403XX0 Polyhydramnios, third trimester, not applicable or unspecified: Secondary | ICD-10-CM

## 2017-12-20 DIAGNOSIS — O411290 Chorioamnionitis, unspecified trimester, not applicable or unspecified: Secondary | ICD-10-CM

## 2017-12-20 LAB — GLUCOSE, CAPILLARY
GLUCOSE-CAPILLARY: 77 mg/dL (ref 70–99)
GLUCOSE-CAPILLARY: 72 mg/dL (ref 70–99)
Glucose-Capillary: 75 mg/dL (ref 70–99)
Glucose-Capillary: 89 mg/dL (ref 70–99)

## 2017-12-20 MED ORDER — PRENATAL MULTIVITAMIN CH
1.0000 | ORAL_TABLET | Freq: Every day | ORAL | Status: DC
  Administered 2017-12-21 – 2017-12-22 (×2): 1 via ORAL
  Filled 2017-12-20 (×2): qty 1

## 2017-12-20 MED ORDER — ONDANSETRON HCL 4 MG PO TABS: 4.0000 mg | ORAL_TABLET | ORAL | Status: DC | PRN

## 2017-12-20 MED ORDER — DIPHENHYDRAMINE HCL 25 MG PO CAPS: 25.0000 mg | ORAL_CAPSULE | Freq: Four times a day (QID) | ORAL | Status: DC | PRN

## 2017-12-20 MED ORDER — ACETAMINOPHEN 325 MG PO TABS
650.0000 mg | ORAL_TABLET | ORAL | Status: DC | PRN
  Administered 2017-12-20 – 2017-12-21 (×4): 650 mg via ORAL
  Filled 2017-12-20 (×4): qty 2

## 2017-12-20 MED ORDER — WITCH HAZEL-GLYCERIN EX PADS: 1.0000 "application " | MEDICATED_PAD | CUTANEOUS | Status: DC | PRN

## 2017-12-20 MED ORDER — ONDANSETRON HCL 4 MG/2ML IJ SOLN: 4.0000 mg | INTRAMUSCULAR | Status: DC | PRN

## 2017-12-20 MED ORDER — DIBUCAINE 1 % RE OINT: 1.0000 "application " | TOPICAL_OINTMENT | RECTAL | Status: DC | PRN

## 2017-12-20 MED ORDER — SIMETHICONE 80 MG PO CHEW: 80.0000 mg | CHEWABLE_TABLET | ORAL | Status: DC | PRN

## 2017-12-20 MED ORDER — OXYTOCIN 40 UNITS IN LACTATED RINGERS INFUSION - SIMPLE MED: 1.0000 m[IU]/min | INTRAVENOUS | Status: DC

## 2017-12-20 MED ORDER — TETANUS-DIPHTH-ACELL PERTUSSIS 5-2.5-18.5 LF-MCG/0.5 IM SUSP: 0.5000 mL | Freq: Once | INTRAMUSCULAR | Status: DC

## 2017-12-20 MED ORDER — COCONUT OIL OIL: 1.0000 "application " | TOPICAL_OIL | Status: DC | PRN

## 2017-12-20 MED ORDER — GENTAMICIN SULFATE 40 MG/ML IJ SOLN
140.0000 mg | Freq: Three times a day (TID) | INTRAVENOUS | Status: DC
  Administered 2017-12-20: 140 mg via INTRAVENOUS
  Filled 2017-12-20 (×3): qty 3.5

## 2017-12-20 MED ORDER — SENNOSIDES-DOCUSATE SODIUM 8.6-50 MG PO TABS
2.0000 | ORAL_TABLET | ORAL | Status: DC
  Administered 2017-12-21: 2 via ORAL
  Filled 2017-12-20 (×2): qty 2

## 2017-12-20 MED ORDER — ZOLPIDEM TARTRATE 5 MG PO TABS: 5.0000 mg | ORAL_TABLET | Freq: Every evening | ORAL | Status: DC | PRN

## 2017-12-20 MED ORDER — IBUPROFEN 600 MG PO TABS
600.0000 mg | ORAL_TABLET | Freq: Four times a day (QID) | ORAL | Status: DC
  Administered 2017-12-20 – 2017-12-22 (×8): 600 mg via ORAL
  Filled 2017-12-20 (×8): qty 1

## 2017-12-20 MED ORDER — GENTAMICIN SULFATE 40 MG/ML IJ SOLN
140.0000 mg | Freq: Once | INTRAVENOUS | Status: AC
  Administered 2017-12-20: 140 mg via INTRAVENOUS
  Filled 2017-12-20: qty 3.5

## 2017-12-20 MED ORDER — SODIUM CHLORIDE 0.9 % IV SOLN
2.0000 g | Freq: Four times a day (QID) | INTRAVENOUS | Status: DC
  Administered 2017-12-20 (×2): 2 g via INTRAVENOUS
  Filled 2017-12-20: qty 2
  Filled 2017-12-20: qty 2000
  Filled 2017-12-20: qty 2

## 2017-12-20 MED ORDER — BENZOCAINE-MENTHOL 20-0.5 % EX AERO
1.0000 "application " | INHALATION_SPRAY | CUTANEOUS | Status: DC | PRN
  Filled 2017-12-20: qty 56

## 2017-12-20 NOTE — Progress Notes (Signed)
Notified of late decelerations- and interventions used. Will continue to monitor

## 2017-12-20 NOTE — Progress Notes (Signed)
LABOR PROGRESS NOTE  Tammy Rivas is a 32 y.o. G1P0000 at [redacted]w[redacted]d admitted for IOL for A1GDM , Polyhydramnios on Monday morning (8/19)  Subjective: Patient doing well, comfortable with epidural. Discussed plan of care with patient - answered patient and family questions. Patient agrees to plan of care.   Objective: BP 112/68   Pulse 85   Temp 98.7 F (37.1 C) (Oral)   Resp 16   Ht 5' 2"  (1.575 m)   Wt 79.4 kg   LMP 03/10/2017   SpO2 99%   BMI 32.01 kg/m  or  Vitals:   12/20/17 0800 12/20/17 0830 12/20/17 0900 12/20/17 0930  BP: (!) 112/58 105/62 110/69 112/68  Pulse: 86 82 84 85  Resp: 18 18 18 16   Temp: 99.4 F (37.4 C)  98.7 F (37.1 C)   TempSrc: Oral  Oral   SpO2:      Weight:      Height:        FSE placed, patient currently has IUPC with amnioinfusion  Dilation: 4 Effacement (%): 90 Cervical Position: Middle Station: -2 Presentation: Vertex Exam by:: VWende BushyCNM  FHT: baseline rate 155, moderate varibility, +acel, variable and late decel Toco: 2-4   Labs: Lab Results  Component Value Date   WBC 10.8 (H) 12/18/2017   HGB 11.4 (L) 12/18/2017   HCT 33.7 (L) 12/18/2017   MCV 95.5 12/18/2017   PLT 268 12/18/2017    Patient Active Problem List   Diagnosis Date Noted  . Labor and delivery indication for care or intervention 12/18/2017  . Polyhydramnios, third trimester, fetus 1 12/10/2017  . Diet controlled White classification A1 gestational diabetes mellitus (GDM) 09/29/2017  . Rash 07/31/2017  . Left hip pain 07/31/2017  . Supervision of high risk pregnancy, antepartum, third trimester 06/09/2017  . History of anxiety 06/09/2017    Assessment / Plan: 32y.o. G1P0000 at 314w5dere for IOL for A1GDM and Poly  Labor: Slow to progress, continue pitocin augmentation, position changed with peanut  Fetal Wellbeing:  Cat II  Pain Control:  Epidural  Anticipated MOD:  Hopeful SVD   RoLajean ManesCNM 12/20/2017, 9:49 AM

## 2017-12-20 NOTE — Progress Notes (Signed)
Dr. Juleen China would like Pitocin restarted at 38m.

## 2017-12-20 NOTE — Progress Notes (Signed)
Labor Progress Note Tammy Rivas is a 32 y.o. G1P0000 at 41w5dpresented for IOL 2/2 A1GDM with poly. IOL started on 12/18/17. Patient developed fever and fetal tachycardia consistent with suspect Triple I. Started on Amp/Gent this AM.  IOL has been protracted and patient still in latent/early labor, now 2d into induction and SROM~18.  S: feeling pressure in rectum with contractions. Has been changing positions.   O:  BP 91/61   Pulse (!) 110   Temp 98.1 F (36.7 C) (Oral)   Resp 18   Ht 5' 2"  (1.575 m)   Wt 79.4 kg   LMP 03/10/2017   SpO2 99%   BMI 32.01 kg/m  EFM: 155/mod/+accels +shallow variable decels  CVE: Dilation: 4 Effacement (%): 90 Cervical Position: Middle Station: -2 Presentation: Vertex Exam by:: VWende BushyCNM    A&P: 32y.o. G1P0000 326w5dere for IOL due to A1GDM #Labor: Progressing slowly. Continue to increase pitocin and recommend 2x2 increase. #Pain: s/p epidural #FWB: Cat 1 currently. Reassuring status #GBS negative  #Triple I: on Amp/Gent  KiCaren MacadamMD 12:29 PM

## 2017-12-20 NOTE — Progress Notes (Signed)
OB[facility]N Faculty [facility]: Labor Progress Note  Subjective: Strip note.   Objective: BP (!) 96/56   Pulse 70   Temp 98.9 F (37.2 C) (Oral)   Resp 18   Ht 5' 2"  (1.575 m)   Wt 79.4 kg   LMP 03/10/2017   SpO2 99%   BMI 32.01 kg/m  Dilation: 1 Effacement (%): 50 Cervical Position: Posterior Station: Ballotable Presentation: Vertex Exam by:: Dr. Juleen China  Assessment and Plan: 32 y.o. G1P0000 108w5dhere for IOL for A1GDM, polyhydramnios.   Labor: Induction. Not yet in labor.  -- s/p 6 doses of cytotec (starting 1000 8/19)  -- pitocin started 1900, IUPC in place -- pain control: epidural now in place  Fetal Well-Being: EFW 3351g (77%) at 38w (8/9). Cephalic by BSUS, sutures.  -- Category II - continuous fetal monitoring - variables so IUPC placed with amnioinfusion, pitocin stopped for recurrent decelerations. Discussed with RN, will plan to restart pitocin.  -- GBS (-)   Joseph Bias S. WJuleen China DO OB/GYN Fellow, Faculty Practice  1:09 AM

## 2017-12-20 NOTE — Progress Notes (Signed)
LABOR PROGRESS NOTE  Tammy Rivas is a 32 y.o. G1P0000 at [redacted]w[redacted]d admitted for IOL for GDM and poly   Subjective: Patient reports pressure in bottom during contractions   Objective: BP 97/60   Pulse 97   Temp 98.4 F (36.9 C) (Oral)   Resp 18   Ht 5' 2"  (1.575 m)   Wt 79.4 kg   LMP 03/10/2017   SpO2 99%   BMI 32.01 kg/m  or  Vitals:   12/20/17 1331 12/20/17 1401 12/20/17 1431 12/20/17 1434  BP: (!) 113/53 (!) 93/54 97/60   Pulse: 94 (!) 101 97   Resp: 18 18 18    Temp:  99.8 F (37.7 C)  98.4 F (36.9 C)  TempSrc:  Oral  Oral  SpO2:      Weight:      Height:        Dilation: Lip/rim Effacement (%): 90 Cervical Position: Middle Station: 0, Plus 1 Presentation: Vertex Exam by:: RStann MainlandCNM FHT: baseline rate 155, moderate varibility, no acel, variable and late decel Toco: 2-4  Labs: Lab Results  Component Value Date   WBC 10.8 (H) 12/18/2017   HGB 11.4 (L) 12/18/2017   HCT 33.7 (L) 12/18/2017   MCV 95.5 12/18/2017   PLT 268 12/18/2017    Patient Active Problem List   Diagnosis Date Noted  . Labor and delivery indication for care or intervention 12/18/2017  . Polyhydramnios, third trimester, fetus 1 12/10/2017  . Diet controlled White classification A1 gestational diabetes mellitus (GDM) 09/29/2017  . Rash 07/31/2017  . Left hip pain 07/31/2017  . Supervision of high risk pregnancy, antepartum, third trimester 06/09/2017  . History of anxiety 06/09/2017    Assessment / Plan: 32y.o. G1P0000 at 343w5dere for IOL for GDM and poly   Labor: Expectant management  Fetal Wellbeing:  Cat II Pain Control:  Epidural  Anticipated MOD:  SVD  RoLajean ManesCNM 12/20/2017, 3:00 PM

## 2017-12-20 NOTE — Progress Notes (Signed)
ANTIBIOTIC CONSULT NOTE - INITIAL  Pharmacy Consult for Gentamicin Indication: Chorioamnionitis   No Known Allergies  Patient Measurements: Height: 5' 2"  (157.5 cm) Weight: 175 lb (79.4 kg) IBW/kg (Calculated) : 50.1 Adjusted Body Weight: 58.9 kg  Vital Signs: Temp: 98.7 F (37.1 C) (08/21 0900) Temp Source: Oral (08/21 0900) BP: 98/62 (08/21 1000) Pulse Rate: 84 (08/21 1000)  Labs: Recent Labs    12/18/17 0752  WBC 10.8*  HGB 11.4*  PLT 268   No results for input(s): GENTTROUGH, GENTPEAK, GENTRANDOM in the last 72 hours.   Assessment: 32 y.o. female G1P0000 at [redacted]w[redacted]d failed IOL (induced 8/19, s/p Cytotec x6) Estimated Ke = 0.316 h-1, Vd = 22.4 L  Goal of Therapy:  Gentamicin peak 6-8 mg/L and Trough < 1 mg/L  Plan:  Gentamicin 140 mg IV every 8 hrs  Check Scr with next labs if gentamicin continued. Will check gentamicin levels if continued > 72hr or clinically indicated.  Izza Bickle 12/20/2017,10:22 AM

## 2017-12-21 NOTE — Progress Notes (Signed)
Post Partum Day 1 TSVD d/t GDM, complicated by chorio s/p amp/gent  Subjective: Pt without complaints this morning except feels sore. Ambulating, voiding, + flatus, tolerating and good oral pain control. Bottle feeding.   Objective: Blood pressure 99/67, pulse 68, temperature 97.6 F (36.4 C), temperature source Oral, resp. rate 17, height 5' 2"  (1.575 m), weight 79.4 kg, last menstrual period 03/10/2017, SpO2 99 %, unknown if currently breastfeeding.  Physical Exam:  General: alert Lochia: appropriate Uterine Fundus: firm Incision: healing well DVT Evaluation: No evidence of DVT seen on physical exam.  No results for input(s): HGB, HCT in the last 72 hours.  Assessment/Plan: Plan for discharge tomorrow   LOS: 3 days   Chancy Milroy 12/21/2017, 9:46 AM

## 2017-12-21 NOTE — Anesthesia Postprocedure Evaluation (Signed)
Anesthesia Post Note  Patient: Tammy Rivas  Procedure(s) Performed: AN AD HOC LABOR EPIDURAL     Patient location during evaluation: Mother Baby Anesthesia Type: Epidural Level of consciousness: awake and alert Pain management: pain level controlled Vital Signs Assessment: post-procedure vital signs reviewed and stable Respiratory status: spontaneous breathing, nonlabored ventilation and respiratory function stable Cardiovascular status: stable Postop Assessment: no headache, no backache and epidural receding Anesthetic complications: no    Last Vitals:  Vitals:   12/21/17 0030 12/21/17 0313  BP: 101/63 103/68  Pulse: 74 69  Resp: 16 16  Temp: 36.5 C (!) 36.3 C  SpO2:      Last Pain:  Vitals:   12/21/17 0316  TempSrc:   PainSc: 6    Pain Goal: Patients Stated Pain Goal: 10 (12/19/17 1931)               Gilmer Mor
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
## 2017-12-22 MED ORDER — ACETAMINOPHEN 325 MG PO TABS: 650.0000 mg | ORAL_TABLET | ORAL | 0 refills | Status: DC | PRN

## 2017-12-22 MED ORDER — IBUPROFEN 600 MG PO TABS: 600.0000 mg | ORAL_TABLET | Freq: Four times a day (QID) | ORAL | 0 refills | Status: DC

## 2017-12-22 NOTE — Discharge Summary (Addendum)
OB Discharge Summary     Patient Name: Tammy Rivas DOB: 18-Jan-1986 MRN: [id number]  Date of admission: 12/18/2017 Delivering MD: Lajean Manes   Date of discharge: 12/22/2017  Admitting diagnosis: INDUCTION Intrauterine pregnancy: [redacted]w[redacted]d    Secondary diagnosis:  Active Problems:   Labor and delivery indication for care or intervention   Chorioamnionitis A1GDM polyhydramnios  Additional problems:      Discharge diagnosis: Term Pregnancy Delivered                                                                                                Post partum procedures:.  Augmentation: Pitocin  Complications: Intrauterine Inflammation or infection (Chorioamniotis)  Hospital course:  Induction of Labor With Vaginal Delivery   32y.o. yo G1P1001 at 33w5das admitted to the hospital 12/18/2017 for induction of labor.  Indication for induction: A1 DM and polyhydramnios.  Patient had an uncomplicated labor course as follows: Membrane Rupture Time/Date: 7:52 PM ,12/19/2017   Intrapartum Procedures: Episiotomy: None [1]                                         Lacerations:  2nd degree [3];Labial [10]  Patient had delivery of a Viable infant.  Information for the patient's newborn:  LeKiernan, Farkas0[[id number]]Delivery Method: Vaginal, Spontaneous(Filed from Delivery Summary)   12/20/2017  Details of delivery can be found in separate delivery note.  Patient had a routine postpartum course. Patient is discharged home 12/22/17.  Physical exam  Vitals:   12/21/17 0811 12/21/17 1548 12/21/17 2334 12/22/17 0522  BP: 99/67 97/62 99/67  104/71  Pulse: 68 81 82 66  Resp: 17 18 16 16   Temp: 97.6 F (36.4 C) 98.1 F (36.7 C) 97.6 F (36.4 C) 98.1 F (36.7 C)  TempSrc: Oral Oral Oral Oral  SpO2: 99%  98% 99%  Weight:      Height:       General: alert, cooperative and no distress Lochia: appropriate Uterine Fundus: firm Incision: n/a DVT Evaluation: No evidence of DVT  seen on physical exam. Labs: Lab Results  Component Value Date   WBC 10.8 (H) 12/18/2017   HGB 11.4 (L) 12/18/2017   HCT 33.7 (L) 12/18/2017   MCV 95.5 12/18/2017   PLT 268 12/18/2017   CMP Latest Ref Rng & Units 09/11/2008  Glucose 70 - 99 mg/dL 80  BUN 6 - 23 mg/dL 12  Creatinine 0.4 - 1.2 mg/dL 0.8  Sodium 135 - 145 mEq/L 143  Potassium 3.5 - 5.1 mEq/L 3.8  Chloride 96 - 112 mEq/L 103  CO2 19 - 32 mEq/L 27  Calcium 8.4 - 10.5 mg/dL 9.6  Total Protein 6.0 - 8.3 g/dL 7.9  Total Bilirubin 0.3 - 1.2 mg/dL 0.5  Alkaline Phos 39 - 117 U/L 52  AST 0 - 37 U/L 22  ALT 0 - 35 U/L 13    Discharge instruction: per After Visit Summary and "Baby and Me Booklet".  After visit meds:  Allergies as of 12/22/2017   No Known Allergies     Medication List    TAKE these medications   ACCU-CHEK AVIVA device Use as instructed   ACCU-CHEK FASTCLIX LANCETS Misc 1 Device by Percutaneous route 4 (four) times daily.   acetaminophen 325 MG tablet Commonly known as:  TYLENOL Take 2 tablets (650 mg total) by mouth every 4 (four) hours as needed (for pain scale < 4).   glucose blood test strip Test CBG's QID   ibuprofen 600 MG tablet Commonly known as:  ADVIL,MOTRIN Take 1 tablet (600 mg total) by mouth every 6 (six) hours.   multivitamin-prenatal 27-0.8 MG Tabs tablet Take 1 tablet by mouth daily at 12 noon.       Diet: routine diet  Activity: Advance as tolerated. Pelvic rest for 6 weeks.   Outpatient follow BR:AXENMMH to confirm appointment Follow up Appt:No future appointments. Follow up Visit:No follow-ups on file.  Postpartum contraception: Nexplanon  Newborn Data: Live born female  Birth Weight: 7 lb 5.6 oz (3334 g) APGAR: 8, 8  Newborn Delivery   Birth date/time:  12/20/2017 17:03:00 Delivery type:  Vaginal, Spontaneous     Baby Feeding: Bottle Disposition:home with mother   12/22/2017 Sherene Sires, DO   I have spoken with and examined this patient and agree  with resident/PA-S/Med-S/SNM's note and plan of care. VSS, HRR&R, Resp unlabored, Legs neg.  Nigel Berthold, CNM 12/26/2017 2:53 PM

## 2017-12-27 ENCOUNTER — Ambulatory Visit (INDEPENDENT_AMBULATORY_CARE_PROVIDER_SITE_OTHER): Payer: Managed Care, Other (non HMO) | Admitting: Obstetrics & Gynecology

## 2017-12-27 ENCOUNTER — Encounter: Payer: Self-pay | Admitting: Obstetrics & Gynecology

## 2017-12-27 VITALS — BP 110/73 | HR 73 | Resp 16 | Ht 62.0 in | Wt 152.0 lb

## 2017-12-27 DIAGNOSIS — B370 Candidal stomatitis: Secondary | ICD-10-CM | POA: Diagnosis not present

## 2017-12-27 MED ORDER — NYSTATIN 100000 UNIT/ML MT SUSP: OROMUCOSAL | 0 refills | Status: DC

## 2017-12-27 NOTE — Progress Notes (Signed)
   Subjective:    Patient ID: Tammy Rivas, female    DOB: 01-Jul-1985, 32 y.o.   MRN: [id number]  HPI  Pt c/o increased burning at laceration site.  This is new for 2 days.  Different than when in hospital.  Pt reports discharge.  No abdominal pain.  Pt c/o sores on throat and whiteness on tongue for the past 2 days.  No URI symptoms.  Pt has not taken any meds to help.   Review of Systems  Constitutional: Negative.   Respiratory: Negative.   Cardiovascular: Negative.   Gastrointestinal: Negative.   Genitourinary: Negative.   Psychiatric/Behavioral: Negative.    Vitals:   12/27/17 0848  BP: 110/73  Pulse: 73  Resp: 16  Weight: 152 lb (68.9 kg)  Height: 5' 2"  (1.575 m)        Objective:   Physical Exam  Constitutional: She is oriented to person, place, and time. She appears well-developed and well-nourished. No distress.  HENT:  Head: Normocephalic and atraumatic.  Geographic tongue, sores on uvula  Eyes: Conjunctivae are normal.  Cardiovascular: Normal rate.  Pulmonary/Chest: Effort normal.  Abdominal: Soft. Bowel sounds are normal. She exhibits no distension and no mass. There is no tenderness. There is no rebound and no guarding.  Musculoskeletal: She exhibits no edema.  Neurological: She is alert and oriented to person, place, and time.  Skin: Skin is warm and dry.  Psychiatric: She has a normal mood and affect.      Vitals reviewed.           Assessment & Plan:  Separation of obstetrical laceration--repair in office Thrush--Swish and Swallow  After informed consent was obtained patient was placed in dorsal lithotomy position the vulva was cleaned with Betadine.  3 cc of 1% lidocaine were injected into the perineum at the level of the second-degree laceration.  After adequate and is seizure, the area was cleaned with a 50% solution H2 O2 and water.  There was no debridement necessary.  The edges of the incision were early enough in the healing process  that reapproximation is possible.  4-0 Vicryl was used to reapproximate the perineum.  Was good restoration of the anatomy at the end of the procedure.  Patient tolerated the procedure well.  All counts were correct.  Keep area clean and dry Lidocaine jelly for pain Motrin for pain Nothing per vagina RTC Friday for laceration evaluation.

## 2018-01-24 ENCOUNTER — Telehealth: Payer: Self-pay | Admitting: Obstetrics & Gynecology

## 2018-01-24 ENCOUNTER — Encounter: Payer: Self-pay | Admitting: Obstetrics and Gynecology

## 2018-01-24 ENCOUNTER — Ambulatory Visit (INDEPENDENT_AMBULATORY_CARE_PROVIDER_SITE_OTHER): Payer: Managed Care, Other (non HMO) | Admitting: Obstetrics and Gynecology

## 2018-01-24 DIAGNOSIS — Z3009 Encounter for other general counseling and advice on contraception: Secondary | ICD-10-CM

## 2018-01-24 DIAGNOSIS — T8131XS Disruption of external operation (surgical) wound, not elsewhere classified, sequela: Secondary | ICD-10-CM

## 2018-01-24 DIAGNOSIS — N8110 Cystocele, unspecified: Secondary | ICD-10-CM

## 2018-01-24 DIAGNOSIS — O3480 Maternal care for other abnormalities of pelvic organs, unspecified trimester: Secondary | ICD-10-CM

## 2018-01-24 NOTE — Progress Notes (Signed)
PT states that she "feels like her uterus or bladder is falling out"

## 2018-01-24 NOTE — Telephone Encounter (Signed)
Patient called stating that she "recently had a baby and something weird is going on down there" I asked patient if she called her OBGYN and she said no. Patient said "I am not quite at the 6 week period yet, I am not sure what to do"?

## 2018-01-24 NOTE — Patient Instructions (Signed)
Contraception Choices Contraception, also called birth control, refers to methods or devices that prevent pregnancy. Hormonal methods Contraceptive implant A contraceptive implant is a thin, plastic tube that contains a hormone. It is inserted into the upper part of the arm. It can remain in place for up to 3 years. Progestin-only injections Progestin-only injections are injections of progestin, a synthetic form of the hormone progesterone. They are given every 3 months by a health care provider. Birth control pills Birth control pills are pills that contain hormones that prevent pregnancy. They must be taken once a day, preferably at the same time each day. Birth control patch The birth control patch contains hormones that prevent pregnancy. It is placed on the skin and must be changed once a week for three weeks and removed on the fourth week. A prescription is needed to use this method of contraception. Vaginal ring A vaginal ring contains hormones that prevent pregnancy. It is placed in the vagina for three weeks and removed on the fourth week. After that, the process is repeated with a new ring. A prescription is needed to use this method of contraception. Emergency contraceptive Emergency contraceptives prevent pregnancy after unprotected sex. They come in pill form and can be taken up to 5 days after sex. They work best the sooner they are taken after having sex. Most emergency contraceptives are available without a prescription. This method should not be used as your only form of birth control. Barrier methods Female condom A female condom is a thin sheath that is worn over the penis during sex. Condoms keep sperm from going inside a woman's body. They can be used with a spermicide to increase their effectiveness. They should be disposed after a single use. Female condom A female condom is a soft, loose-fitting sheath that is put into the vagina before sex. The condom keeps sperm from going  inside a woman's body. They should be disposed after a single use.  Intrauterine contraception Intrauterine device (IUD) An IUD is a T-shaped device that is put in a woman's uterus. There are two types:  Hormone IUD.This type contains progestin, a synthetic form of the hormone progesterone. This type can stay in place for 3-5 years.  Copper IUD.This type is wrapped in copper wire. It can stay in place for 10 years.  Permanent methods of contraception Female tubal ligation In this method, a woman's fallopian tubes are sealed, tied, or blocked during surgery to prevent eggs from traveling to the uterus.  Female sterilization This is a procedure to tie off the tubes that carry sperm (vasectomy). After the procedure, the man can still ejaculate fluid (semen).  Summary  Contraception, also called birth control, means methods or devices that prevent pregnancy.  Hormonal methods of contraception include implants, injections, pills, patches, vaginal rings, and emergency contraceptives.  Barrier methods of contraception can include female condoms, female condoms, diaphragms, cervical caps, sponges, and spermicides.  There are two types of IUDs (intrauterine devices). An IUD can be put in a woman's uterus to prevent pregnancy for 3-5 years.  Permanent sterilization can be done through a procedure for males, females, or both. This information is not intended to replace advice given to you by your health care provider. Make sure you discuss any questions you have with your health care provider. Document Released: 04/18/2005 Document Revised: 05/21/2016 Document Reviewed: 05/21/2016 Elsevier Interactive Patient Education  2018 [employer] implant What is this medicine? ETONOGESTREL (et oh noe JES trel) is a contraceptive (birth  control) device. It is used to prevent pregnancy. It can be used for up to 3 years. This medicine may be used for other purposes; ask your health care  provider or pharmacist if you have questions. COMMON BRAND NAME(S): Implanon, Nexplanon What should I tell my health care provider before I take this medicine? They need to know if you have any of these conditions: -abnormal vaginal bleeding -blood vessel disease or blood clots -cancer of the breast, cervix, or liver -depression -diabetes -gallbladder disease -headaches -heart disease or recent heart attack -high blood pressure -high cholesterol -kidney disease -liver disease -renal disease -seizures -tobacco smoker -an unusual or allergic reaction to etonogestrel, other hormones, anesthetics or antiseptics, medicines, foods, dyes, or preservatives -pregnant or trying to get pregnant -breast-feeding How should I use this medicine? This device is inserted just under the skin on the inner side of your upper arm by a health care professional. Talk to your pediatrician regarding the use of this medicine in children. Special care may be needed. Overdosage: If you think you have taken too much of this medicine contact a poison control center or emergency room at once. NOTE: This medicine is only for you. Do not share this medicine with others. What if I miss a dose? This does not apply. What may interact with this medicine? Do not take this medicine with any of the following medications: -amprenavir -bosentan -fosamprenavir This medicine may also interact with the following medications: -barbiturate medicines for inducing sleep or treating seizures -certain medicines for fungal infections like ketoconazole and itraconazole -grapefruit juice -griseofulvin -medicines to treat seizures like carbamazepine, felbamate, oxcarbazepine, phenytoin, topiramate -modafinil -phenylbutazone -rifampin -rufinamide -some medicines to treat HIV infection like atazanavir, indinavir, lopinavir, nelfinavir, tipranavir, ritonavir -St. John's wort This list may not describe all possible interactions.  Give your health care provider a list of all the medicines, herbs, non-prescription drugs, or dietary supplements you use. Also tell them if you smoke, drink alcohol, or use illegal drugs. Some items may interact with your medicine. What should I watch for while using this medicine? This product does not protect you against HIV infection (AIDS) or other sexually transmitted diseases. You should be able to feel the implant by pressing your fingertips over the skin where it was inserted. Contact your doctor if you cannot feel the implant, and use a non-hormonal birth control method (such as condoms) until your doctor confirms that the implant is in place. If you feel that the implant may have broken or become bent while in your arm, contact your healthcare provider. What side effects may I notice from receiving this medicine? Side effects that you should report to your doctor or health care professional as soon as possible: -allergic reactions like skin rash, itching or hives, swelling of the face, lips, or tongue -breast lumps -changes in emotions or moods -depressed mood -heavy or prolonged menstrual bleeding -pain, irritation, swelling, or bruising at the insertion site -scar at site of insertion -signs of infection at the insertion site such as fever, and skin redness, pain or discharge -signs of pregnancy -signs and symptoms of a blood clot such as breathing problems; changes in vision; chest pain; severe, sudden headache; pain, swelling, warmth in the leg; trouble speaking; sudden numbness or weakness of the face, arm or leg -signs and symptoms of liver injury like dark yellow or brown urine; general ill feeling or flu-like symptoms; light-colored stools; loss of appetite; nausea; right upper belly pain; unusually weak or tired; yellowing of the  eyes or skin -unusual vaginal bleeding, discharge -signs and symptoms of a stroke like changes in vision; confusion; trouble speaking or understanding;  severe headaches; sudden numbness or weakness of the face, arm or leg; trouble walking; dizziness; loss of balance or coordination Side effects that usually do not require medical attention (report to your doctor or health care professional if they continue or are bothersome): -acne -back pain -breast pain -changes in weight -dizziness -general ill feeling or flu-like symptoms -headache -irregular menstrual bleeding -nausea -sore throat -vaginal irritation or inflammation This list may not describe all possible side effects. Call your doctor for medical advice about side effects. You may report side effects to FDA at 1-800-FDA-1088. Where should I keep my medicine? This drug is given in a hospital or clinic and will not be stored at home. NOTE: This sheet is a summary. It may not cover all possible information. If you have questions about this medicine, talk to your doctor, pharmacist, or health care provider.  2018 Elsevier/Gold Standard (2015-11-05 11:19:22) Intrauterine Device Information An intrauterine device (IUD) is inserted into your uterus to prevent pregnancy. There are two types of IUDs available:  Copper IUD-This type of IUD is wrapped in copper wire and is placed inside the uterus. Copper makes the uterus and fallopian tubes produce a fluid that kills sperm. The copper IUD can stay in place for 10 years.  Hormone IUD-This type of IUD contains the hormone progestin (synthetic progesterone). The hormone thickens the cervical mucus and prevents sperm from entering the uterus. It also thins the uterine lining to prevent implantation of a fertilized egg. The hormone can weaken or kill the sperm that get into the uterus. One type of hormone IUD can stay in place for 5 years, and another type can stay in place for 3 years.  Your health care provider will make sure you are a good candidate for a contraceptive IUD. Discuss with your health care provider the possible side  effects. Advantages of an intrauterine device  IUDs are highly effective, reversible, long acting, and low maintenance.  There are no estrogen-related side effects.  An IUD can be used when breastfeeding.  IUDs are not associated with weight gain.  The copper IUD works immediately after insertion.  The hormone IUD works right away if inserted within 7 days of your period starting. You will need to use a backup method of birth control for 7 days if the hormone IUD is inserted at any other time in your cycle.  The copper IUD does not interfere with your female hormones.  The hormone IUD can make heavy menstrual periods lighter and decrease cramping.  The hormone IUD can be used for 3 or 5 years.  The copper IUD can be used for 10 years. Disadvantages of an intrauterine device  The hormone IUD can be associated with irregular bleeding patterns.  The copper IUD can make your menstrual flow heavier and more painful.  You may experience cramping and vaginal bleeding after insertion. This information is not intended to replace advice given to you by your health care provider. Make sure you discuss any questions you have with your health care provider. Document Released: 03/22/2004 Document Revised: 09/24/2015 Document Reviewed: 10/07/2012 Elsevier Interactive Patient Education  2017 [employer].

## 2018-01-24 NOTE — Progress Notes (Signed)
Obstetrics/Postpartum Visit  Appointment Date: 01/24/2018  OBGYN Clinic: [facility]  Primary Care Provider: Patient, No Pcp Per  Chief Complaint:  Chief Complaint  Patient presents with  . Postpartum Care    History of Present Illness: Tammy Rivas is a 32 y.o. Caucasian G1P1001 (Patient's last menstrual period was 03/10/2017.), seen for the above chief complaint. Her past medical history is significant for gDMA1   She is s/p SVD on 12/20/17 at 39 weeks; she was discharged to home on PPD#2. Pregnancy complicated by gDMA1. Delivery complicated by 2nd degree laceration, after which she had breakdown and repair in office.  Complains of "balloon like bulge in vagina" and some tenderness at laceration repair site.  Vaginal bleeding or discharge: No  Breast or formula feeding: bottle Intercourse: No  Contraception: unsure PP depression s/s: No  Any bowel or bladder issues: Yes , occasional hard stool Pap smear: no abnormalities (date: 11/2016)  Review of Systems: Positive for n/a.   Her 12 point review of systems is negative or as noted in the History of Present Illness.  Patient Active Problem List   Diagnosis Date Noted  . Chorioamnionitis 12/20/2017  . Labor and delivery indication for care or intervention 12/18/2017  . Polyhydramnios, third trimester, fetus 1 12/10/2017  . Diet controlled White classification A1 gestational diabetes mellitus (GDM) 09/29/2017  . Rash 07/31/2017  . Left hip pain 07/31/2017  . Supervision of high risk pregnancy, antepartum, third trimester 06/09/2017  . History of anxiety 06/09/2017    Medications Estelita Iten "Jess" had no medications administered during this visit. Current Outpatient Medications  Medication Sig Dispense Refill  . Prenatal Vit-Fe Fumarate-FA (MULTIVITAMIN-PRENATAL) 27-0.8 MG TABS tablet Take 1 tablet by mouth daily at 12 noon.    Marland Kitchen ACCU-CHEK FASTCLIX LANCETS MISC 1 Device by Percutaneous route 4 (four) times  daily. (Patient not taking: Reported on 12/27/2017) 100 each 12  . acetaminophen (TYLENOL) 325 MG tablet Take 2 tablets (650 mg total) by mouth every 4 (four) hours as needed (for pain scale < 4). (Patient not taking: Reported on 01/24/2018) 75 tablet 0  . Blood Glucose Monitoring Suppl (ACCU-CHEK AVIVA) device Use as instructed (Patient not taking: Reported on 12/27/2017) 1 each 0  . glucose blood (ACCU-CHEK ACTIVE STRIPS) test strip Test CBG's QID (Patient not taking: Reported on 12/27/2017) 100 each 12  . ibuprofen (ADVIL,MOTRIN) 600 MG tablet Take 1 tablet (600 mg total) by mouth every 6 (six) hours. (Patient not taking: Reported on 01/24/2018) 30 tablet 0  . nystatin (MYCOSTATIN) 100000 UNIT/ML suspension Swish and swallow 5 ml qid for 10 days (Patient not taking: Reported on 01/24/2018) 60 mL 0   No current facility-administered medications for this visit.     Allergies Patient has no known allergies.  Physical Exam:  BP 100/73   Pulse 76   Ht 5' 4"  (1.626 m)   Wt 145 lb (65.8 kg)   LMP 03/10/2017   Breastfeeding? No   BMI 24.89 kg/m  Body mass index is 24.89 kg/m. General appearance: Well nourished, well developed female in no acute distress.  Cardiovascular: regular rate and rhythm Respiratory:  Clear to auscultation bilateral. Normal respiratory effort Abdomen: positive bowel sounds and no masses, hernias; diffusely non tender to palpation, non distended Breasts: not examined. Neuro/Psych:  Normal mood and affect.  Skin:  Warm and dry.   Pelvic exam: is not limited by body habitus EGBUS: within normal limits GU: perineal laceration appears to be healing well, no areas of separation  or further repair required however not entirely healed yet, small amount granulation tissue noted, no induration, mild tenderness at site, sutures trimmed. Minimal uterine descent with valsalva, when standing, Grade II cystocele present spontaneously, no tenderness on exam   PP Depression Screening:   negative  Assessment: Patient is a 32 y.o. G1P1001 who is 5 weeks post partum from a SVD complicated by laceration repair breakdown that is now healing well. She is doing well. Cystocele noted on exam. Undecided regarding contraception.    Plan:   1. Postpartum exam Cystocele noted but otherwise benign exam No concern for post partum depression  2. Perineal laceration during delivery, postpartum condition Healing well, will have return to ensure fully healed  3. Postoperative wound breakdown, sequela  4. Cystocele affecting pregnancy, unspecified trimester Reviewed diagnosis, may need further treatment in future Encouraged kegels Encouraged stool softener to avoid straining  5. Encounter for counseling regarding contraception Had long conversation about options for contraception, risks/benefits of each. She is undecided and would like to consider but leaning towards IUD - will return for exam 3 weeks and possible LARC placement  RTC 3 weeks for possible IUD placement   K. Arvilla Meres, M.D. [facility]

## 2018-01-24 NOTE — Telephone Encounter (Signed)
Spoke with patinet. Advised her to call her [facility] and speak with triage nurse at their office until released by their office back to routine GYN care. Advised she is still in post partum period. She will call their office today for evaluation and care.  Message to Melvia Heaps CNM and encounter closed.

## 2018-01-30 ENCOUNTER — Ambulatory Visit: Payer: Managed Care, Other (non HMO) | Admitting: Certified Nurse Midwife

## 2018-01-31 ENCOUNTER — Ambulatory Visit: Payer: Managed Care, Other (non HMO)

## 2018-02-13 ENCOUNTER — Ambulatory Visit: Payer: Managed Care, Other (non HMO) | Admitting: Obstetrics & Gynecology

## 2018-02-13 ENCOUNTER — Ambulatory Visit: Payer: Managed Care, Other (non HMO) | Admitting: Certified Nurse Midwife

## 2018-03-21 ENCOUNTER — Telehealth: Payer: Self-pay

## 2018-03-21 NOTE — Telephone Encounter (Addendum)
Left a voicemail on pt's phone letting her know this. I provided office phone number for her to call and schedule appt for Depo  ----- Message from Guss Bunde, MD sent at 03/20/2018  9:02 PM EST ----- Regarding: RE: Birth Control- Depo She will need to have had only protected intercourse for 2 weeks and a negative pregnancy test on the day of depo.  Please order and see when she can get the shot.    ThankS ----- Message ----- From: Lyndal Rainbow, CMA Sent: 03/20/2018   2:04 PM EST To: Sloan Leiter, MD Subject: Birth Control- Depo                            Hey Rayburn Go,  You saw this pt for her PP visit on 01/24/18. In your note, it says she is undecided about birth control. Now she called and wants Depo. Does she need to be seen again or will you just send this in?  Thanks!

## 2018-03-27 ENCOUNTER — Telehealth: Payer: Self-pay

## 2018-03-27 ENCOUNTER — Ambulatory Visit (INDEPENDENT_AMBULATORY_CARE_PROVIDER_SITE_OTHER): Payer: Managed Care, Other (non HMO)

## 2018-03-27 DIAGNOSIS — Z3042 Encounter for surveillance of injectable contraceptive: Secondary | ICD-10-CM

## 2018-03-27 DIAGNOSIS — Z3202 Encounter for pregnancy test, result negative: Secondary | ICD-10-CM

## 2018-03-27 DIAGNOSIS — Z30013 Encounter for initial prescription of injectable contraceptive: Secondary | ICD-10-CM

## 2018-03-27 LAB — POCT URINE PREGNANCY: Preg Test, Ur: NEGATIVE

## 2018-03-27 MED ORDER — MEDROXYPROGESTERONE ACETATE 150 MG/ML IM SUSP
150.0000 mg | Freq: Once | INTRAMUSCULAR | Status: AC
  Administered 2018-03-27: 150 mg via INTRAMUSCULAR

## 2018-03-27 MED ORDER — MEDROXYPROGESTERONE ACETATE 150 MG/ML IM SUSP: 150.0000 mg | INTRAMUSCULAR | 4 refills | Status: DC

## 2018-03-27 NOTE — Progress Notes (Addendum)
PT here for Depo injection. Pt has not had unprotected intercourse in the last 2 weeks. UPT negative.  PT will return in 12 weeks for next Depo.

## 2018-03-27 NOTE — Addendum Note (Signed)
Addended by: Lyndal Rainbow on: 03/27/2018 04:50 PM   Modules accepted: Orders

## 2018-03-27 NOTE — Telephone Encounter (Signed)
Rx for Depo sent per Dr.Leggett. PT is coming today for UPT and injection. PT states she has not had unprotected intercourse in the last 2 weeks. Nurse visit appt made.

## 2019-01-10 DIAGNOSIS — K56600 Partial intestinal obstruction, unspecified as to cause: Secondary | ICD-10-CM | POA: Insufficient documentation

## 2019-07-04 IMAGING — US US MFM OB FOLLOW-UP
1 series · 14 of 28 positions shown · non-contrast
Comparison: none

[Series 1: us mfm ob follow-up · 28 acquisitions, 14 frames shown]
[im 2/28]
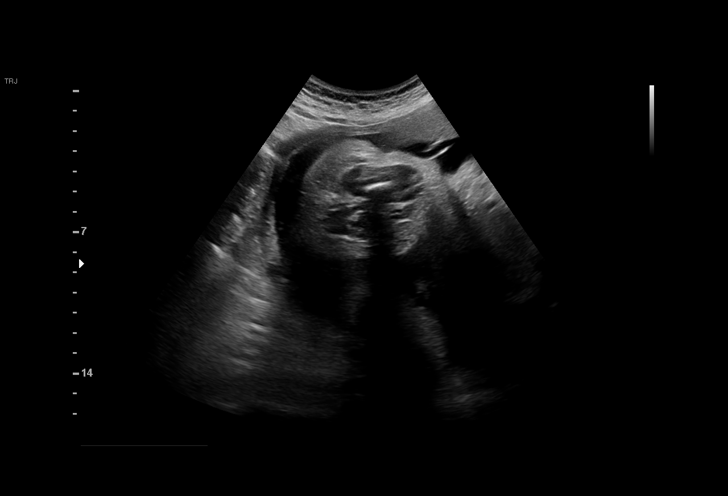
[im 4/28]
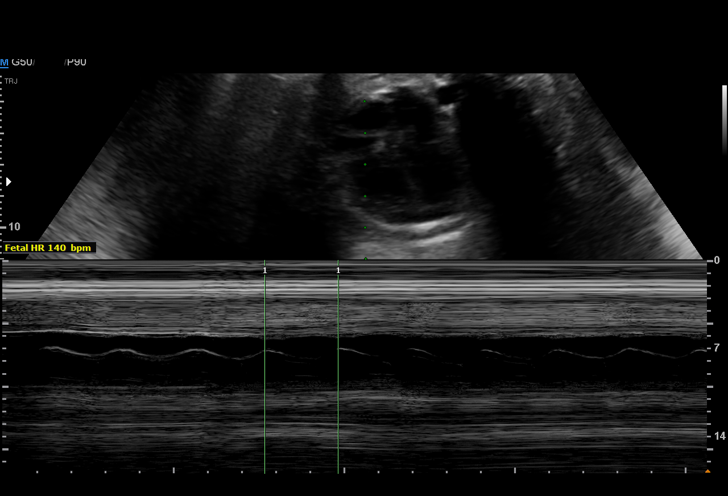
[im 6/28]
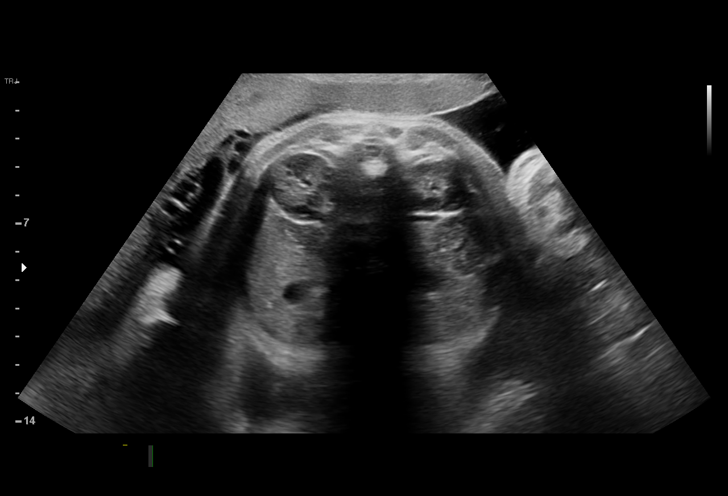
[im 8/28]
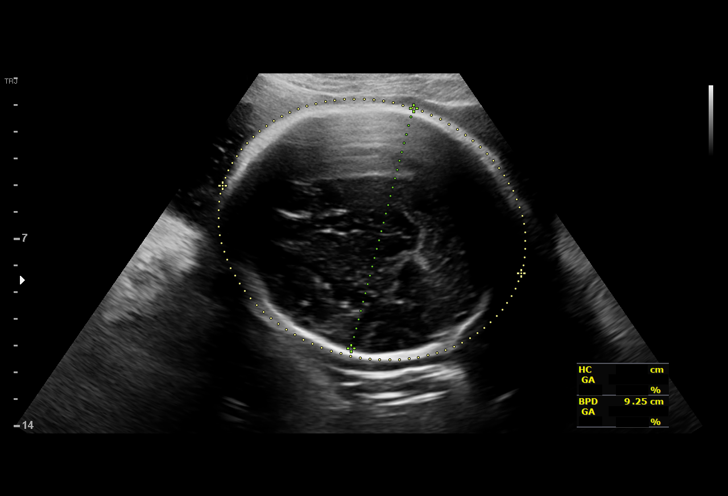
[im 10/28]
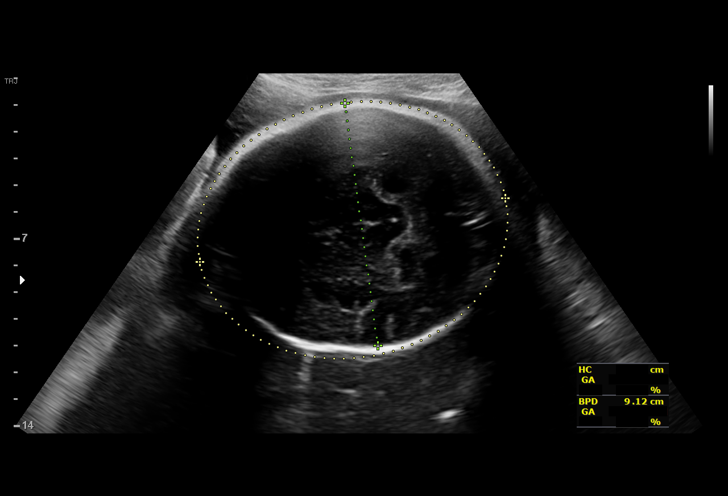
[im 12/28]
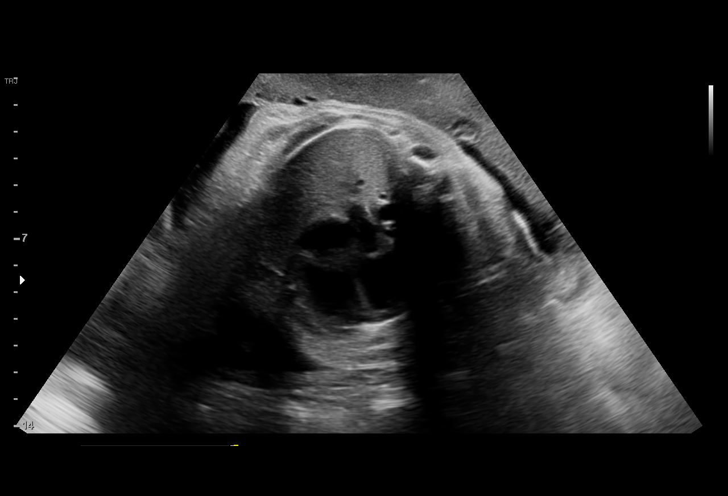
[im 14/28]
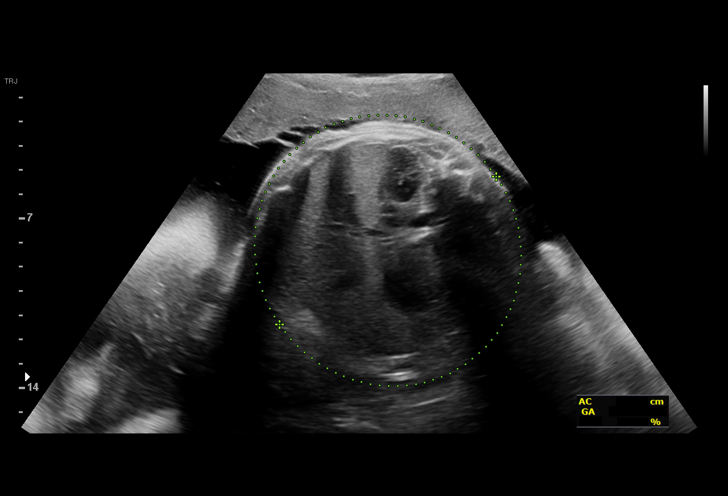
[im 16/28]
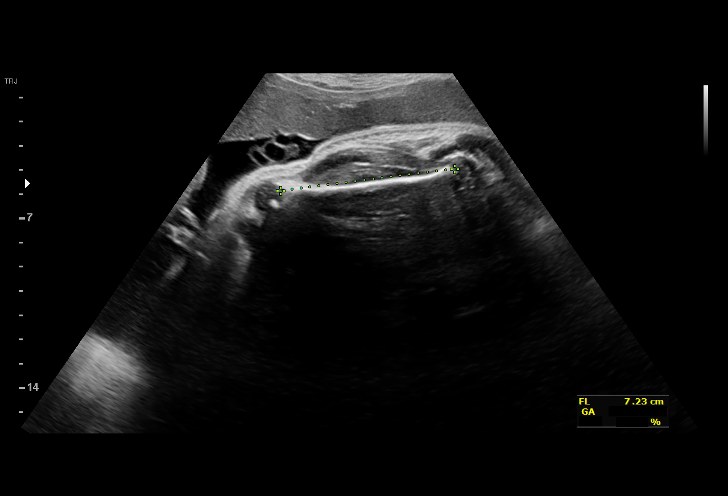
[im 18/28]
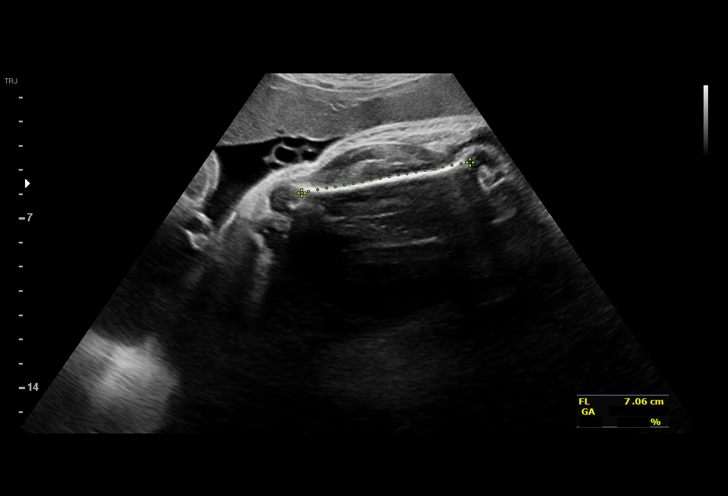
[im 20/28]
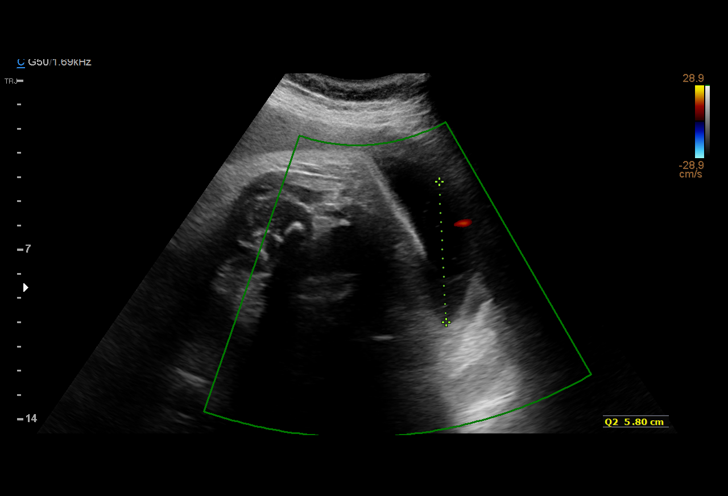
[im 22/28]
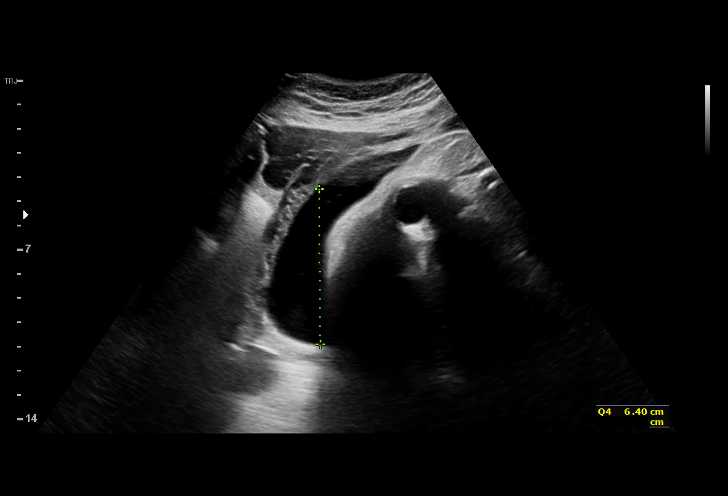
[im 24/28]
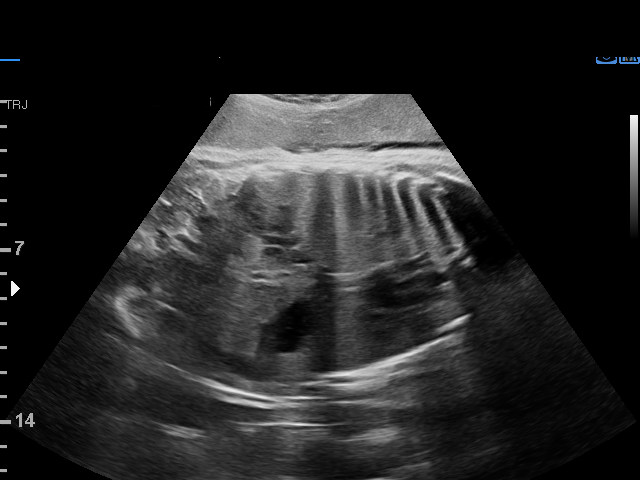
[im 26/28]
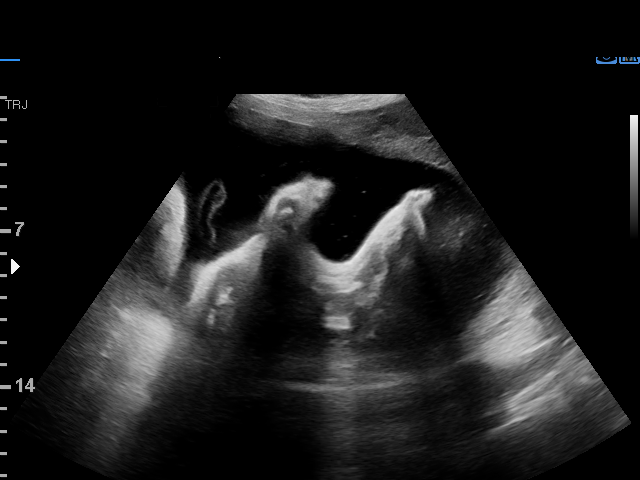
[im 28/28]
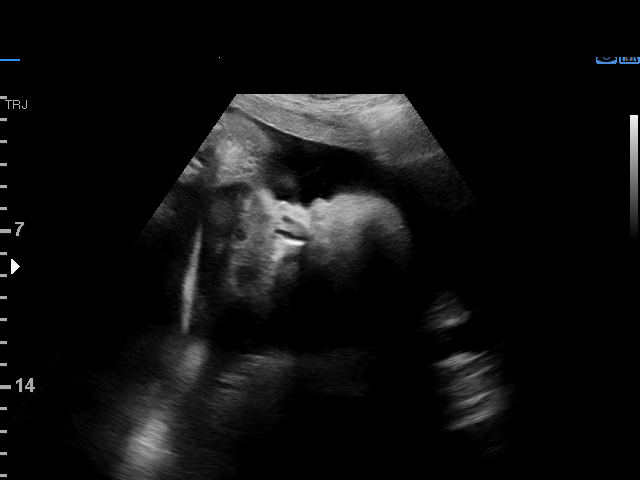

[14 of 28 positions shown; findings below may reference images not displayed]

1  LEGENDE BARKET           [id number]      [id number]     [id number]
2  LEGENDE BARKET           [id number]      [id number]     [id number]
Indications

38 weeks gestation of pregnancy
Gestational diabetes in pregnancy, diet
controlled
OB History

Blood Type:            Height:  5'2"   Weight (lb):  141       BMI:
Gravidity:    1
Fetal Evaluation

Num Of Fetuses:     1
Fetal Heart         140
Rate(bpm):
Cardiac Activity:   Observed
Presentation:       Cephalic
Placenta:           Anterior

Amniotic Fluid
AFI FV:      Polyhydramnios

AFI Sum(cm)     %Tile       Largest Pocket(cm)
30.44           > 97

RUQ(cm)       RLQ(cm)       LUQ(cm)        LLQ(cm)
8.45
Biophysical Evaluation
Amniotic F.V:   Pocket => 2 cm two         F. Tone:        Observed
planes
F. Movement:    Observed                   N.S.T:          Reactive
F. Breathing:   Observed                   Score:          [DATE]
Biometry

BPD:      92.2  mm     G. Age:  37w 3d         59  %    CI:        76.53   %    70 - 86
FL/HC:      21.6   %    20.9 -
HC:      333.9  mm     G. Age:  38w 1d         34  %    HC/AC:      0.97        0.92 -
AC:      345.5  mm     G. Age:  38w 3d         78  %    FL/BPD:     78.1   %    71 - 87
FL:         72  mm     G. Age:  36w 6d         25  %    FL/AC:      20.8   %    20 - 24

Est. FW:    5515  gm      7 lb 6 oz     77  %
Gestational Age

LMP:           39w 0d        Date:  03/10/17                 EDD:   12/15/17
U/S Today:     37w 5d                                        EDD:   12/24/17
Best:          38w 0d     Det. By:  Previous Ultrasound      EDD:   12/22/17
(06/09/17)
Anatomy

Cranium:               Appears normal         Stomach:                Appears normal, left
sided
Ventricles:            Appears normal         Kidneys:                Appear normal
Heart:                 Previously seen        Bladder:                Appears normal
Impression

Patient returned for fetal growth assessment and antenatal
testing. She has gestational diabetes, which is reportedly well-
controlled on diet. She does not have hypertension or any
other high risk conditions.

On ultrasound, the fetal growth is appropriate for the
gestational age. Polyhydramnios is seen (AFI=30 cm). Fetal
stomach appears normal. Antenatal testing is reassuring.
NST is reactive. BPP [DATE].

I explained he finding of polyhydramnios and diabetes is the
likely cause. Even in well-controlled diabetes polyhydramnios
can be seen. Rarely, some anomalies may be present that
could be identified only after birth. Most cases of
polyhydramnios are not associated with adverse outcomes.

Recommendations

-Antenatal testing next week.
-Patient reports she will be undergoing induction on 12/17/17.

## 2019-07-12 ENCOUNTER — Telehealth: Payer: Self-pay | Admitting: Obstetrics & Gynecology

## 2019-07-12 NOTE — Telephone Encounter (Signed)
Spoke with patient. Patient reports shooting pain and fullness in left breast tissue, started 1 wk ago. Is unsure if ant lumps are present, states breast always feel "lumpy". Hx of bilateral breast reduction in 2016, states she lost feeling in the left breast when she had the surgery, nipple always stays flat, even with stimulation. Denies redness, warmth, nipple d/c, fever/chills. Not currently on menses.   Advised OV recommended for further evaluation, OV scheduled for 3/15 at 10:30am with Melvia Heaps, CNM. ER precautions review for new or worsening symptoms. Covid 19 precautions reviewed, prescreen negative.   Last OV 05/18/17 AEX 12/08/16 with Dr. Sabra Heck.   Routing to provider for final review. Patient is agreeable to disposition. Will close encounter.  Cc: Melvia Heaps, CNM

## 2019-07-12 NOTE — Telephone Encounter (Signed)
Patient is having a shooting pain and swelling in left breast for a week.

## 2019-07-15 ENCOUNTER — Other Ambulatory Visit: Payer: Self-pay

## 2019-07-15 ENCOUNTER — Ambulatory Visit (INDEPENDENT_AMBULATORY_CARE_PROVIDER_SITE_OTHER): Payer: Managed Care, Other (non HMO) | Admitting: Certified Nurse Midwife

## 2019-07-15 ENCOUNTER — Telehealth: Payer: Self-pay | Admitting: *Deleted

## 2019-07-15 ENCOUNTER — Encounter: Payer: Self-pay | Admitting: Certified Nurse Midwife

## 2019-07-15 VITALS — BP 110/80 | HR 64 | Temp 97.9°F | Resp 16 | Wt 141.0 lb

## 2019-07-15 DIAGNOSIS — N644 Mastodynia: Secondary | ICD-10-CM

## 2019-07-15 DIAGNOSIS — N912 Amenorrhea, unspecified: Secondary | ICD-10-CM

## 2019-07-15 LAB — POCT URINE PREGNANCY: Preg Test, Ur: NEGATIVE

## 2019-07-15 NOTE — Progress Notes (Signed)
   Subjective:   34 y.o.  Caucasian female presents for evaluation of left breast fullness and shooting pains through breast. Onset of the symptoms was 2 months ago. No nipple discharge or redness of left breast. Has noted shooting pains in lower left breast area, but occasional in right. Went AT[employer] riding and felt more tenderness which was unusual for her.  Patient sought evaluation because of breast tenderness and soreness and shooting pains of breast and  also into axilla. Shaves every 2-3 days, no soreness after or swollen lymph nodes that she is aware of.  Contributing factors none. Previous breast reduction surgery bilateral. Patient wondered" if areas of numbness in breast were changing and causing shooting pains". Denies chills, fatigue, fevers and malaise. Patient denies history of trauma, bites, or injuries. Last mammogram was none.  Previous evaluation none. No other health concerns today. Friend recently diagnosed with breast cancer. No family history of breast cancer.   Review of Systems Pertinent items noted in HPI and remainder of comprehensive ROS otherwise negative.@SUBJECTIVE    Objective:   General appearance: alert, cooperative, appears stated age and no distress Breasts: No nipple retraction or dimpling, No nipple discharge or bleeding, No axillary or supraclavicular adenopathy, tenderness noted in left breast from 3 to 6 o'clock, scarring noted from breast reduction also. No tenderness in axillary area bilateral.  Right breast non tender, no masses palpated, no skin change or redness.   Assessment:   ASSESSMENT:Patient is diagnosed with left breast tenderness and fullness noted from 3-6 o'clock. History of bilateral breast reduction. Amenorrhea with negative UPT (period due soon)   Plan:   PLAN: Discussed findings with patient of slight fullness in left breast, but this could also be from the reduction. Due tenderness and axilla tenderness feel she should diagnostic mammogram  with [place]. Patient agreeable. She will be called with information and scheduled.

## 2019-07-15 NOTE — Patient Instructions (Signed)
Breast Tenderness Breast tenderness is a common problem for women of all ages, but may also occur in men. Breast tenderness may range from mild discomfort to severe pain. In women, the pain usually comes and goes with the menstrual cycle, but it can also be constant. Breast tenderness has many possible causes, including hormone changes, infections, and taking certain medicines. You may have tests, such as a mammogram or an ultrasound, to check for any unusual findings. Having breast tenderness usually does not mean that you have breast cancer. Follow these instructions at home: Managing pain and discomfort   If directed, put ice to the painful area. To do this: ? Put ice in a plastic bag. ? Place a towel between your skin and the bag. ? Leave the ice on for 20 minutes, 2-3 times a day.  Wear a supportive bra, especially during exercise. You may also want to wear a supportive bra while sleeping if your breasts are very tender. Medicines  Take over-the-counter and prescription medicines only as told by your health care provider. If the cause of your pain is infection, you may be prescribed an antibiotic medicine.  If you were prescribed an antibiotic, take it as told by your health care provider. Do not stop taking the antibiotic even if you start to feel better. Eating and drinking  Your health care provider may recommend that you lessen the amount of fat in your diet. You can do this by: ? Limiting fried foods. ? Cooking foods using methods such as baking, boiling, grilling, and broiling.  Decrease the amount of caffeine in your diet. Instead, drink more water and choose caffeine-free drinks. General instructions   Keep a log of the days and times when your breasts are most tender.  Ask your health care provider how to do breast exams at home. This will help you notice if you have an unusual growth or lump.  Keep all follow-up visits as told by your health care provider. This is  important. Contact a health care provider if:  Any part of your breast is hard, red, and hot to the touch. This may be a sign of infection.  You are a woman and: ? Not breastfeeding and you have fluid, especially blood or pus, coming out of your nipples. ? Have a new or painful lump in your breast that remains after your menstrual period ends.  You have a fever.  Your pain does not improve or it gets worse.  Your pain is interfering with your daily activities. Summary  Breast tenderness may range from mild discomfort to severe pain.  Breast tenderness has many possible causes, including hormone changes, infections, and taking certain medicines.  It can be treated with ice, wearing a supportive bra, and medicines.  Make changes to your diet if told to by your health care provider. This information is not intended to replace advice given to you by your health care provider. Make sure you discuss any questions you have with your health care provider. Document Revised: 09/10/2018 Document Reviewed: 09/10/2018 Elsevier Patient Education  [employer].

## 2019-07-15 NOTE — Telephone Encounter (Signed)
Spoke with Anderson Malta at [facility]. Patient scheduled for bilateral Dx MMG and left breast US on 08/01/19 at [facility] in [place] hold.   Patient notified of appt date, time and contact information. AEX scheduled for 08/12/19 at 1:30pm with Dr. Sabra Heck.   Routing to provider for final review. Patient is agreeable to disposition. Will close encounter.  Cc: Dr. Sabra Heck

## 2019-07-15 NOTE — Telephone Encounter (Addendum)
-----   Message from Regina Eck, CNM sent at 07/15/2019  1:01 PM EDT ----- Patient aware she will be called with appointment  ASSESSMENT:Patient is diagnosed with left breast tenderness and fullness noted from 3-6 o'clock. History of bilateral breast reduction. Amenorrhea with negative UPT (period due soon)

## 2019-07-19 ENCOUNTER — Encounter: Payer: Self-pay | Admitting: Certified Nurse Midwife

## 2019-08-01 ENCOUNTER — Ambulatory Visit: Payer: Managed Care, Other (non HMO)

## 2019-08-01 ENCOUNTER — Ambulatory Visit
Admission: RE | Admit: 2019-08-01 | Discharge: 2019-08-01 | Disposition: A | Discharge status: Home / Self Care | Payer: Managed Care, Other (non HMO) | Source: Ambulatory Visit | Attending: Certified Nurse Midwife | Admitting: Certified Nurse Midwife

## 2019-08-01 ENCOUNTER — Other Ambulatory Visit: Payer: Self-pay

## 2019-08-01 DIAGNOSIS — N644 Mastodynia: Secondary | ICD-10-CM

## 2019-08-09 ENCOUNTER — Other Ambulatory Visit: Payer: Self-pay

## 2019-08-12 ENCOUNTER — Encounter: Payer: Self-pay | Admitting: Obstetrics & Gynecology

## 2019-08-12 ENCOUNTER — Other Ambulatory Visit: Payer: Self-pay

## 2019-08-12 ENCOUNTER — Ambulatory Visit (INDEPENDENT_AMBULATORY_CARE_PROVIDER_SITE_OTHER): Payer: Managed Care, Other (non HMO) | Admitting: Obstetrics & Gynecology

## 2019-08-12 ENCOUNTER — Other Ambulatory Visit (HOSPITAL_COMMUNITY)
Admission: RE | Admit: 2019-08-12 | Discharge: 2019-08-12 | Disposition: A | Discharge status: Home / Self Care | Payer: Managed Care, Other (non HMO) | Source: Ambulatory Visit | Attending: Obstetrics & Gynecology | Admitting: Obstetrics & Gynecology

## 2019-08-12 VITALS — Temp 98.1°F | Ht 62.75 in | Wt 139.0 lb

## 2019-08-12 DIAGNOSIS — Z01419 Encounter for gynecological examination (general) (routine) without abnormal findings: Secondary | ICD-10-CM | POA: Diagnosis not present

## 2019-08-12 DIAGNOSIS — Z8632 Personal history of gestational diabetes: Secondary | ICD-10-CM | POA: Diagnosis not present

## 2019-08-12 DIAGNOSIS — K5090 Crohn's disease, unspecified, without complications: Secondary | ICD-10-CM | POA: Insufficient documentation

## 2019-08-12 DIAGNOSIS — Z124 Encounter for screening for malignant neoplasm of cervix: Secondary | ICD-10-CM | POA: Diagnosis present

## 2019-08-12 DIAGNOSIS — K50012 Crohn's disease of small intestine with intestinal obstruction: Secondary | ICD-10-CM
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
## 2019-08-13 LAB — CYTOLOGY - PAP
Comment: NEGATIVE
Diagnosis: NEGATIVE
High risk HPV: NEGATIVE

## 2019-08-13 LAB — HEMOGLOBIN A1C
Est. average glucose Bld gHb Est-mCnc: 103 mg/dL
Hgb A1c MFr Bld: 5.2 % (ref 4.8–5.6)

## 2019-09-03 ENCOUNTER — Encounter: Payer: Self-pay | Admitting: Obstetrics & Gynecology

## 2019-09-03 ENCOUNTER — Telehealth: Payer: Self-pay | Admitting: Obstetrics & Gynecology

## 2019-09-03 NOTE — Telephone Encounter (Signed)
AEX 08/12/19 with Dr Sabra Heck  Crohn's disease dx 01/2019, follows [facility] No h/o migraines  Used Depo Provera  in 2019 Used Junel in 2018 LMP 08/13/19.  Spoke with patient. Pt reports wanting to get on OCPs again due going to wait on pregnancy. Pt states used OCPs Junel in past and was successful. Declines wanting to use Depo Provera again. Pt requesting wanting to know what Dr Sabra Heck recommends for lowest/safest dose OCP. Advised pt will review with Dr Sabra Heck and return call. Pt agreeable.   Routing to Dr Sabra Heck for review.

## 2019-09-03 NOTE — Telephone Encounter (Signed)
Non-Urgent Medical Question Received: Today Message Contents  Tammy Rivas, Tammy "Jess" sent to [place]  Phone Number: [phone]  Hey there, I just saw you a few weeks ago. I would like to get some birth control subscribed please. Something with the least side effects/safest. Thank you!

## 2019-09-04 NOTE — Telephone Encounter (Signed)
She's used loestrin 1/20 Fe in the past and this is a low dosage pill.  The lowest is Loloestrin and it's ok to try this as well.  I will place order if she has a preference.

## 2019-09-04 NOTE — Telephone Encounter (Signed)
Spoke with patient. Patient states she is unable to talk right now, will return call to office on 09/05/19.

## 2019-09-05 NOTE — Telephone Encounter (Signed)
Left message for pt to return call to triage RN. 

## 2019-09-12 NOTE — Telephone Encounter (Signed)
Left message for pt to return call to triage RN. 

## 2019-09-19 NOTE — Telephone Encounter (Signed)
Attempted to return call to pt. Voicemail box full. Will send Mychart message to pt and will wait on return call to office.

## 2019-09-25 NOTE — Telephone Encounter (Signed)
AEX 08/2019 LMP 09/20/19 regular flow cycle   Spoke with pt. Pt given recommendations per Dr Sabra Heck on OCPs. Pt agreeable to use the Loestrin FE as she did in the past. Pt has not had birth control in almost a year. Pt is SA. States no chance of pregnancy. Is currently on cycle that started on 09/20/19. Denies any heavy bleeding or clots with regular monthly cycles.  Pt advised to start pill pack this Sunday when approved. Pt agreeable and verbalized understanding.   Rx pended for # 3 packs, 3 RF to get to next AEX in 07/2020. Pharmacy verified.   Routing to Dr Sabra Heck

## 2019-09-26 MED ORDER — NORETHIN ACE-ETH ESTRAD-FE 1-20 MG-MCG PO TABS: 1.0000 | ORAL_TABLET | Freq: Every day | ORAL | 3 refills | Status: AC

## 2019-09-26 NOTE — Telephone Encounter (Signed)
Rx signed.  Ok to close encounter.

## 2019-09-26 NOTE — Telephone Encounter (Signed)
Left detailed message per DPR and pt's request of Rx refill. Pt to return call to office with any questions or concerns.   Encounter closed.
